# Patient Record
Sex: Female | Born: 1950
Health system: Southern US, Community
[De-identification: ages and names within clinical notes are randomized; demographics above are authoritative.]

## PROBLEM LIST (undated history)

## (undated) DIAGNOSIS — E785 Hyperlipidemia, unspecified: Secondary | ICD-10-CM

## (undated) DIAGNOSIS — F32A Depression, unspecified: Secondary | ICD-10-CM

## (undated) DIAGNOSIS — F329 Major depressive disorder, single episode, unspecified: Secondary | ICD-10-CM

## (undated) DIAGNOSIS — E079 Disorder of thyroid, unspecified: Secondary | ICD-10-CM

## (undated) DIAGNOSIS — F419 Anxiety disorder, unspecified: Secondary | ICD-10-CM

## (undated) HISTORY — PX: MOUTH SURGERY: SHX715

## (undated) HISTORY — PX: OTHER SURGICAL HISTORY: SHX169

## (undated) HISTORY — DX: Disorder of thyroid, unspecified: E07.9

## (undated) HISTORY — DX: Depression, unspecified: F32.A

## (undated) HISTORY — DX: Anxiety disorder, unspecified: F41.9

## (undated) HISTORY — DX: Hyperlipidemia, unspecified: E78.5

## (undated) HISTORY — PX: SHOULDER SURGERY: SHX246

## (undated) HISTORY — PX: WISDOM TOOTH EXTRACTION: SHX21

## (undated) HISTORY — DX: Major depressive disorder, single episode, unspecified: F32.9

## (undated) HISTORY — PX: TUBAL LIGATION: SHX77

---

## 1998-08-01 ENCOUNTER — Ambulatory Visit (HOSPITAL_COMMUNITY): Admission: RE | Admit: 1998-08-01 | Discharge: 1998-08-01 | Payer: Self-pay | Admitting: Otolaryngology

## 1998-08-01 ENCOUNTER — Encounter: Payer: Self-pay | Admitting: Otolaryngology

## 1999-11-11 ENCOUNTER — Encounter: Payer: Self-pay | Admitting: Obstetrics & Gynecology

## 1999-11-11 ENCOUNTER — Encounter: Admission: RE | Admit: 1999-11-11 | Discharge: 1999-11-11 | Payer: Self-pay | Admitting: Obstetrics & Gynecology

## 2000-11-11 ENCOUNTER — Encounter: Admission: RE | Admit: 2000-11-11 | Discharge: 2000-11-11 | Payer: Self-pay | Admitting: Thoracic Surgery

## 2000-11-11 ENCOUNTER — Encounter: Payer: Self-pay | Admitting: Obstetrics & Gynecology

## 2000-12-15 ENCOUNTER — Encounter: Payer: Self-pay | Admitting: Internal Medicine

## 2001-09-09 ENCOUNTER — Other Ambulatory Visit: Admission: RE | Admit: 2001-09-09 | Discharge: 2001-09-09 | Payer: Self-pay | Admitting: Obstetrics & Gynecology

## 2001-11-16 ENCOUNTER — Encounter: Payer: Self-pay | Admitting: Obstetrics & Gynecology

## 2001-11-16 ENCOUNTER — Encounter: Admission: RE | Admit: 2001-11-16 | Discharge: 2001-11-16 | Payer: Self-pay | Admitting: Obstetrics & Gynecology

## 2002-09-20 ENCOUNTER — Encounter: Payer: Self-pay | Admitting: Orthopedic Surgery

## 2002-09-20 ENCOUNTER — Ambulatory Visit (HOSPITAL_COMMUNITY): Admission: RE | Admit: 2002-09-20 | Discharge: 2002-09-20 | Payer: Self-pay | Admitting: Orthopedic Surgery

## 2002-10-14 ENCOUNTER — Other Ambulatory Visit: Admission: RE | Admit: 2002-10-14 | Discharge: 2002-10-14 | Payer: Self-pay | Admitting: Obstetrics & Gynecology

## 2002-11-22 ENCOUNTER — Encounter: Payer: Self-pay | Admitting: Obstetrics & Gynecology

## 2002-11-22 ENCOUNTER — Encounter: Admission: RE | Admit: 2002-11-22 | Discharge: 2002-11-22 | Payer: Self-pay | Admitting: Obstetrics & Gynecology

## 2002-12-29 ENCOUNTER — Ambulatory Visit (HOSPITAL_COMMUNITY): Admission: RE | Admit: 2002-12-29 | Discharge: 2002-12-29 | Payer: Self-pay | Admitting: Orthopedic Surgery

## 2002-12-29 ENCOUNTER — Ambulatory Visit (HOSPITAL_BASED_OUTPATIENT_CLINIC_OR_DEPARTMENT_OTHER): Admission: RE | Admit: 2002-12-29 | Discharge: 2002-12-29 | Payer: Self-pay | Admitting: Orthopedic Surgery

## 2003-11-21 ENCOUNTER — Other Ambulatory Visit: Admission: RE | Admit: 2003-11-21 | Discharge: 2003-11-21 | Payer: Self-pay | Admitting: Obstetrics & Gynecology

## 2003-11-23 ENCOUNTER — Encounter: Admission: RE | Admit: 2003-11-23 | Discharge: 2003-11-23 | Payer: Self-pay | Admitting: Obstetrics & Gynecology

## 2004-11-25 ENCOUNTER — Encounter: Admission: RE | Admit: 2004-11-25 | Discharge: 2004-11-25 | Payer: Self-pay | Admitting: Obstetrics & Gynecology

## 2004-12-05 ENCOUNTER — Other Ambulatory Visit: Admission: RE | Admit: 2004-12-05 | Discharge: 2004-12-05 | Payer: Self-pay | Admitting: Obstetrics & Gynecology

## 2005-11-27 ENCOUNTER — Encounter: Admission: RE | Admit: 2005-11-27 | Discharge: 2005-11-27 | Payer: Self-pay | Admitting: Obstetrics & Gynecology

## 2006-12-01 ENCOUNTER — Encounter: Admission: RE | Admit: 2006-12-01 | Discharge: 2006-12-01 | Payer: Self-pay | Admitting: Obstetrics & Gynecology

## 2007-12-02 ENCOUNTER — Encounter: Admission: RE | Admit: 2007-12-02 | Discharge: 2007-12-02 | Payer: Self-pay | Admitting: Obstetrics & Gynecology

## 2007-12-08 ENCOUNTER — Encounter: Admission: RE | Admit: 2007-12-08 | Discharge: 2007-12-08 | Payer: Self-pay | Admitting: Obstetrics & Gynecology

## 2008-12-06 ENCOUNTER — Encounter: Admission: RE | Admit: 2008-12-06 | Discharge: 2008-12-06 | Payer: Self-pay | Admitting: Obstetrics & Gynecology

## 2009-11-30 ENCOUNTER — Telehealth: Payer: Self-pay | Admitting: Internal Medicine

## 2009-12-04 ENCOUNTER — Ambulatory Visit: Payer: Self-pay | Admitting: Internal Medicine

## 2009-12-04 DIAGNOSIS — R197 Diarrhea, unspecified: Secondary | ICD-10-CM | POA: Insufficient documentation

## 2009-12-04 DIAGNOSIS — E039 Hypothyroidism, unspecified: Secondary | ICD-10-CM | POA: Insufficient documentation

## 2009-12-04 LAB — CONVERTED CEMR LAB
Basophils Absolute: 0 10*3/uL (ref 0.0–0.1)
Eosinophils Absolute: 0.1 10*3/uL (ref 0.0–0.7)
HCT: 37.6 % (ref 36.0–46.0)
Hemoglobin: 13 g/dL (ref 12.0–15.0)
Lymphs Abs: 1.5 10*3/uL (ref 0.7–4.0)
MCHC: 34.6 g/dL (ref 30.0–36.0)
MCV: 92 fL (ref 78.0–100.0)
Neutro Abs: 3.1 10*3/uL (ref 1.4–7.7)
RDW: 12.9 % (ref 11.5–14.6)

## 2009-12-05 ENCOUNTER — Encounter: Payer: Self-pay | Admitting: Physician Assistant

## 2009-12-10 ENCOUNTER — Encounter: Admission: RE | Admit: 2009-12-10 | Discharge: 2009-12-10 | Payer: Self-pay | Admitting: Internal Medicine

## 2010-04-22 ENCOUNTER — Encounter: Payer: Self-pay | Admitting: Obstetrics & Gynecology

## 2010-05-02 NOTE — Procedures (Signed)
Summary: Colonoscopy   Colonoscopy  Procedure date:  12/15/2000  Findings:      Location:  [facility].   Patient Name: Jo Vega, Jo Vega MRN:  Procedure Procedures: Colonoscopy CPT: 16109.  Personnel: Endoscopist: Dora L. Juanda Chance, MD.  Referred By: Maureen Ralphs Daphine Deutscher, MD.  Exam Location: Exam performed in Outpatient Clinic. Outpatient  Patient Consent: Procedure, Alternatives, Risks and Benefits discussed, consent obtained, from patient.  Indications Symptoms: Hematochezia.  Average Risk Screening Routine.  History  Pre-Exam Physical: Performed Dec 15, 2000. Cardio-pulmonary exam, Rectal exam, HEENT exam , Abdominal exam, Extremity exam, Neurological exam, Mental status exam WNL.  Exam Exam: Extent of exam reached: Cecum, extent intended: Cecum.  Colon retroflexion performed. Images taken. ASA Classification: I. Tolerance: good.  Monitoring: Pulse and BP monitoring, Oximetry used. Supplemental O2 given.  Colon Prep Used Golytely for colon prep. Prep results: fair.  Sedation Meds: Patient assessed and found to be appropriate for moderate (conscious) sedation. Fentanyl 100 mcg. Versed 10 mg.  Findings NORMAL EXAM: Cecum.   Assessment Normal examination.  Comments: no polyps Events  Unplanned Interventions: No intervention was required.  Unplanned Events: There were no complications. Plans Patient Education: Patient given standard instructions for: Yearly hemoccult testing recommended. Patient instructed to get routine colonoscopy every 10 years.  Comments: high fiber diet Disposition: After procedure patient sent to recovery. After recovery patient sent home.  Scheduling/Referral: Follow-Up prn.   This report was created from the original endoscopy report, which was reviewed and signed by the above listed endoscopist.     cc: Madison Hickman, MD     W.Varney Baas, MD

## 2010-05-02 NOTE — Progress Notes (Signed)
Summary: Triage  Phone Note Call from Patient Call back at Home Phone [phone] Call back at [phone]   Caller: Patient Call For: Dr. Juanda Chance Reason for Call: Talk to Nurse Summary of Call: continuous diarrhea since July....wants to know if she can be worked in with [employer]. Initial call taken by: Karna Christmas,  November 30, 2009 11:26 AM  Follow-up for Phone Call        patient with a 4 month hx of diarrhea.  She has been treated bt Dr Waynard Edwards with no success.  She reports daily diarrhea.  Patient  will come in and see Dr Juanda Chance on 12-04-09 10:30.  I have asked her to contact Dr Perini's office to send [place] records.   Follow-up by: Darcey Nora RN, CGRN,  November 30, 2009 11:40 AM

## 2010-05-02 NOTE — Assessment & Plan Note (Signed)
Summary: diarrhea/Jo Vega   History of Present Illness Visit Type: Initial Visit Primary GI MD: Jo Sar MD Primary Provider: Rodrigo Ran, MD Chief Complaint: Chronic diarhea, started 09/28/09, History of Present Illness:   PLEASANT 60 YO FEMALE KNOWN TO DR. Juanda Chance FROM SCREENING COLONOSCOPY IN 2002. THIS WAS NORMAL. SHE IS REFERRED TODAY WITH PERSISTENT DIARRHEA,ONSET ACUTELY 7/4 WEEKEND. SHE HAD BEEN ON A SAILING TRIP WITH HER HUSBAND. APPARENTLY NO ONE ELSE BECAME ILL BUT HER HUSBAND DID HAVE SOME MILD DIARRHEA. SHE DRANK BOTTLED WATER,DID NOT SWIM IN THE OCEAN,ATE SEAFOOD,BUT NOT RAW. SHE HAS BEEN HAVING DIARRHEA EVER SINCE WITH URGENCY AND A COUPLE OF INCONTINENT EPISODES. GENERALLY HAVING 7-8 LOOSE TO WATERY STOOLS PER DAY,NONBLOODY, NONMALODOROUS. NO FEVER. NO N/V,MINIMAL CRAMPING. SHE WAS SEEN AT DR.PERINI'S OFFICE-NO STOOL CULTURES DONE. INITIALLY GIVEN ALIGN AND IMMODIUM,NO IMPROVEMENT. THEN A COURSE OF FLAGYL X 7 DAYS, NO IMPROVEMENT, THEN ANOTHER ABX X 3 DAYS THAT SHE DOES NOT KNOW THE NAME OF, NO HELP.  SHE DID TAKE AN ABX IN MAY FOR A UTI, NO OTHER NEW MEDS ETC. CBC,AND BMET 10/13/09 UNREMARKABLE.   GI Review of Systems    Reports acid reflux.      Denies abdominal pain, belching, bloating, chest pain, dysphagia with liquids, dysphagia with solids, heartburn, loss of appetite, nausea, vomiting, vomiting blood, weight loss, and  weight gain.      Reports change in bowel habits, diarrhea, and  fecal incontinence.     Denies anal fissure, black tarry stools, constipation, diverticulosis, heme positive stool, hemorrhoids, irritable bowel syndrome, jaundice, light color stool, liver problems, rectal bleeding, and  rectal pain. Preventive Screening-Counseling & Management  Alcohol-Tobacco     Smoking Status: never      Drug Use:  no.      Current Medications (verified): 1)  Synthroid 50 Mcg Tabs (Levothyroxine Sodium) .... Once Daily 2)  Multivitamins  Tabs (Multiple Vitamin)  .... Once Daily 3)  Calcium 500 Mg Tabs (Calcium) .... Once Daily 4)  Aspirin 81 Mg Tbec (Aspirin) .... Once Daily 5)  Fish Oil 1000 Mg Caps (Omega-3 Fatty Acids) .... Once Daily 6)  Vitamin C 500 Mg Tabs (Ascorbic Acid) .... Once Daily 7)  Glucosamine-Chondroitin  Caps (Glucosamine-Chondroit-Vit C-Mn) .... Once Daily 8)  Vitamin D 400 Unit Tabs (Cholecalciferol) .... Once Daily 9)  Tylenol Pm Extra Strength 500-25 Mg Tabs (Diphenhydramine-Apap (Sleep)) .... At Bedtime For Sleep  Allergies (verified): 1)  ! Codeine  Past History:  Past Medical History: HYPOTHYROID  Past Surgical History: Tubal Ligation Shoulder Surgery  Family History: Family History of Breast Cancer:Mother No FH of Colon Cancer:  Social History: Occupation:  MARRIED Patient has never smoked.  Alcohol Use - yes Daily Caffeine Use Illicit Drug Use - no Smoking Status:  never Drug Use:  no  Review of Systems  The patient denies allergy/sinus, anemia, anxiety-new, arthritis/joint pain, back pain, blood in urine, breast changes/lumps, change in vision, confusion, cough, coughing up blood, depression-new, fainting, fatigue, fever, headaches-new, hearing problems, heart murmur, heart rhythm changes, itching, menstrual pain, muscle pains/cramps, night sweats, nosebleeds, pregnancy symptoms, shortness of breath, skin rash, sleeping problems, sore throat, swelling of feet/legs, swollen lymph glands, thirst - excessive , urination - excessive , urination changes/pain, urine leakage, vision changes, and voice change.         SEE HPI  Vital Signs:  Patient profile:   60 year old female Height:      64 inches Weight:      138.13 pounds BMI:  23.80 Pulse rate:   68 / minute Pulse rhythm:   regular BP sitting:   108 / 70  (left arm) Cuff size:   regular  Vitals Entered By: June McMurray CMA Duncan Dull) (December 04, 2009 10:39 AM)  Physical Exam  General:  Well developed, well nourished, no acute  distress. Head:  Normocephalic and atraumatic. Eyes:  PERRLA, no icterus. Lungs:  Clear throughout to auscultation. Heart:  Regular rate and rhythm; no murmurs, rubs,  or bruits. Abdomen:  SOFT, NONTENDER, NO MASS OR HSM,BS+ Rectal:  NOT DONE Extremities:  No clubbing, cyanosis, edema or deformities noted. Neurologic:  Alert and  oriented x4;  grossly normal neurologically. Psych:  Alert and cooperative. Normal mood and affect.   Impression & Recommendations:  Problem # 1:  DIARRHEA (ICD-787.91) Assessment New 59 YO FEMALE WITH 8 WEEK HX OF PERSISTENT DIARRHEA,ACUTE ONSET-UNRESPONSIVE TO FLAGYL X ONE WEEK, AND A 3 DAY COURSE OF ANOTHER ABX... STILL SUSPECT INFECTIOUS ETIOLOGY, R/O GIARDIASIS,R/O C.DIFF.  LABS AND STOOL CULTURES AS BELOW HOLD ABX UNTIL CULTURES REVIEWED CONTINUE ALIGN ONE DAILY X 30 MORE DAYS IMMODIUM 2 EACH AM THEN AS NEEDED DURING DAY MAY NEED COLONOSCOPY ,BUT WILL REVIEW CULTURES/TREAT FIRST. Orders: TLB-CBC Platelet - w/Differential (85025-[id number]) TLB-Sedimentation Rate (ESR) (85652-ESR) T-Culture, Stool ([id number]) T-Culture, C-Diff Toxin A/B ([id number]) T-Stool Giardia / Crypto- EIA (09811) T-Stool for O&P ([id number]) T-Fecal WBC ([id number])  Problem # 2:  HYPOTHYROIDISM (ICD-244.9) Assessment: Comment Only  Patient Instructions: 1)  Please go to lab, basement level. 2)  Continue Align once daily for 2 months. 3)  Take Imodium 2 each AM up to 8 per day. 4)  Copy sent to : Dr. Rodrigo Ran 5)  The medication list was reviewed and reconciled.  All changed / newly prescribed medications were explained.  A complete medication list was provided to the patient / caregiver.

## 2010-06-17 ENCOUNTER — Encounter: Payer: Self-pay | Admitting: Internal Medicine

## 2010-06-27 NOTE — Letter (Signed)
Summary: Pre Visit Letter Revised  [facility]  [address] [place], [place] [address]   Phone: [phone]  Fax: [phone]        06/17/2010 MRN: [id number] Jo Vega [address] [place], [place]  [address]             Procedure Date:  08-06-10 9am            Dr Juanda Chance   Recall Colon                Welcome to the Gastroenterology Division at [facility].    You are scheduled to see a nurse for your pre-procedure visit on 07-22-10  at 10am on the 3rd floor at [facility], [address]  We ask that you try to arrive at our office 15 minutes prior to your appointment time to allow for check-in.  Please take a minute to review the attached form.  If you answer "Yes" to one or more of the questions on the first page, we ask that you call the person listed at your earliest opportunity.  If you answer "No" to all of the questions, please complete the rest of the form and bring it to your appointment.    Your nurse visit will consist of discussing your medical and surgical history, your immediate family medical history, and your medications.   If you are unable to list all of your medications on the form, please bring the medication bottles to your appointment and we will list them.  We will need to be aware of both prescribed and over the counter drugs.  We will need to know exact dosage information as well.    Please be prepared to read and sign documents such as consent forms, a financial agreement, and acknowledgement forms.  If necessary, and with your consent, a friend or relative is welcome to sit-in on the nurse visit with you.  Please bring your insurance card so that we may make a copy of it.  If your insurance requires a referral to see a specialist, please bring your referral form from your primary care physician.  No co-pay is required for this nurse visit.     If you cannot keep your appointment, please call [phone] to cancel or  reschedule prior to your appointment date.  This allows [place] the opportunity to schedule an appointment for another patient in need of care.    Thank you for choosing [facility] for your medical needs.  We appreciate the opportunity to care for you.  Please visit [place] at our website  to learn more about our practice.  Sincerely, The Gastroenterology Division

## 2010-07-22 ENCOUNTER — Ambulatory Visit (AMBULATORY_SURGERY_CENTER): Payer: BC Managed Care – PPO | Admitting: *Deleted

## 2010-07-22 ENCOUNTER — Encounter: Payer: Self-pay | Admitting: Internal Medicine

## 2010-07-22 VITALS — Ht 64.0 in | Wt 136.2 lb

## 2010-07-22 DIAGNOSIS — Z1211 Encounter for screening for malignant neoplasm of colon: Secondary | ICD-10-CM

## 2010-07-22 MED ORDER — PEG-KCL-NACL-NASULF-NA ASC-C 100 G PO SOLR: ORAL | Status: DC

## 2010-08-06 ENCOUNTER — Other Ambulatory Visit: Payer: Self-pay | Admitting: Internal Medicine

## 2010-08-15 ENCOUNTER — Other Ambulatory Visit: Payer: BC Managed Care – PPO | Admitting: Internal Medicine

## 2010-08-16 NOTE — Op Note (Signed)
NAMESANDE, PICKERT                            ACCOUNT NO.:  [id number]   MEDICAL RECORD NO.:  [id number]                   PATIENT TYPE:  AMB   LOCATION:  [facility]                                  FACILITY:  [facility]   PHYSICIAN:  Katy Fitch. Naaman Plummer., M.D.          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  Also, acromioclavicular arthropathy  documented by plain films and MRI and signs of a near full thickness  degenerative tear of the infraspinatus rotator cuff tendon.   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  Also, acromioclavicular arthropathy  documented by plain films and MRI and signs of a near full thickness  degenerative tear of the infraspinatus rotator cuff tendon.  Identification  of minor synovitis within the glenohumeral joint and a chronic stage II  impingement with florid bursitis.   OPERATION PERFORMED:  1. Examination of left shoulder under anesthesia.  2. Arthroscopic evaluation of glenohumeral joint with debridement of minor     synovitis and debridement of extensive degenerative tearing of     supraspinatus and infraspinatus deep surfaces measuring approximately 30%     of the tendon width at the insertion of the greater tuberosity and type     degeneration of the anterior, superior and posterior labrum from     approximately 2 o'clock to 10 o'clock posteriorly.  3. Arthroscopic subacromial decompression with release of coracoacromial     ligament, bursectomy, acromioplasty and documentation of intact bursal     surface of rotator cuff.  4. Open resection of  distal clavicle, i.e. Mumford procedure.   SURGEON:  Katy Fitch. Sypher, M.D.   ASSISTANT:  Jonni Sanger, P.A.   ANESTHESIA:  General orotracheal supplemented by interscalene block.   SUPERVISING ANESTHESIOLOGIST:  Dr. Jean Rosenthal.   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  Since January of 2004 she has had  increasing pain in her left shoulder made worse by weightbearing activities,  overhead activities and painful at night when she attempts to sleep on the  left side.  She tolerated this for approximately six months and tried home  remedies including exercise and yoga training.  With persistent discomfort,  she sought an upper extremity orthopedic consult on September 16, 2002.  Clinical  examination at that time suggested significant rotator cuff tendinopathy  with probable stage II impingement.  Plain films suggested a degree of  acromioclavicular arthropathy and clinical examination demonstrated some  weakness of abduction and external rotation.  She was noted to have a type 2 acromion and sclerosis of the greater  tuberosity that is typically associated with degenerative tendinopathy.  There was no sign of glenohumeral degenerative change.  She was referred for  an MRI at [facility] that was obtained on September 20, 2002  and interpreted by Bridgett Larsson, M.D.  Dr. Constance Goltz noted significant  intrasubstance partial tears of the infraspinatus and supraspinatus and a  degree of hemorrhage within the posterior aspect of the supraspinatus  consistent with a very small partial or moderate thickness tear of the  rotator cuff.  She was noted to have a type 2 acromion and degenerative  change at the acromioclavicular joint.   On follow-up consultation on October 10, 2002 she noted persistent discomfort.  We advised her to try continued therapeutic exercise and a period of  observation to see if her  symptoms would abate.  With continuing pain for an  additional three months, she now presents for operative intervention.  Preoperatively, she was advised to strongly consider open resection of the  distal clavicle with the advantages being the ability to repair the  trapezius and deltoid muscles for stronger initial repair and closure of the  dead space created by distal clavicle resection.  We discussed alternative  means of treatment including arthroscopic resection of the distal clavicle.  We planned on debriding the rotator cuff and possibly repairing the rotator  cuff tear if it was found to be full thickness or more extensive than the  MRI study in June had suggested.   Preoperatively, she was advised to have surgery with an interscalene block.  Germaine Pomfret, M.D. performed detailed preoperative informed consent  regarding the risks and benefits of intrascalene block.  Our plan is to  perform surgery on an outpatient basis with analgesia from the block.  However, should we find a full thickness rotator cuff tear or perform a  significant incision, I would recommend overnight observation or intravenous  antibiotics and enhanced pain control.   DESCRIPTION OF PROCEDURE:  Deltha Bernales was brought to the operating room and  placed in supine position on the operating table.  Following induction of  general anesthesia, she was carefully positioned in beach chair position  with the aid of a torso and head holder designed for shoulder arthroscopy.  The left upper extremity was carefully examined under anesthesia.  She was  noted to have range of motion 0 to 180 degrees of abduction and elevation.  She had 95 degrees of external rotation and 90 degrees abduction and  approximately 80 degrees of internal rotation.  The shoulder was stable at  30, 90 and 120 degrees of elevation to anterior posterior stress.  There was  no sign of an unstable labrum or generalized ligamentous  laxity.  After routine prep of the left upper extremity and forequarter, impervious  arthroscopy drapes and impervious stockinette allowing free motion of the  arm were applied.  The shoulder was distended with 20mL of sterile saline  with a 20 gauge spinal needle from a posterior approach.  The arthroscope  was introduced with blunt technique from posterior standard portal.  Diagnostic arthroscopy revealed a remarkable degree of labral degeneration  with multiple fragments of labrum hanging within the joint.  The biceps  anchor was intact.  The biceps tendon was pristine.  The rotator interval  was inspected and the biceps was noted to be intact distally to its passage  between the tuberosities.  The  subscapularis was normal in appearance.  There was moderate degree of synovitis noted anteriorly.  There was a small  degree of adhesions noted anteriorly probably due to a very minor adhesive  capsulitis.  The deep surface of the supraspinatus and infraspinatus tendons  were markedly degenerative with multiple fragments of frayed degenerative  tendon visible within the joint.  The deep surface of the teres minor was  inspected and found to be normal.  The hyaline articular cartilage surfaces  of the glenoid and humeral head were normal.  The anterior glenohumeral  capsular ligaments were intact.   An anterior portal was created under direct vision after sounding with a  spinal needle.  A 4.5 mm suction shaver was introduced.  A thorough  debridement of the labral fray was accomplished followed by debridement of  all of the degenerative tendon tissue on the deep surface of the  supraspinatus and infraspinatus tendons.  Photographic documentation of the  before and after anatomy was obtained.  We did not identify any signs of a  full thickness cuff tear.   The scope was removed from the glenohumeral joint and placed in the  subacromial space from posterior.  An angry bursitis was noted  and  documented photographically.  A suction shaver was brought in through  lateral portal and a complete bursectomy accomplished allowing visualization  of the rotator cuff from the subscapularis anteriorly to the teres minor  posteriorly.  There was no sign of a full thickness rotator cuff tear.  The  acromial morphology was studied.  There was a clear prominence of the medial  acromion adjacent to the acromioclavicular joint.  A radiofrequency probe  was used to release the coracoacromial ligament and a portion of the  ligament was resected with a 4.5 mm suction shaver.  Hemostasis was achieved  with a radiofrequency probe. The acromioclavicular joint was found to be  mildly unstable and the distal clavicle prominent.   The scope was placed in the lateral portal and a suction shaver used to  level the acromion to a type 1 morphology with particular attention to the  anterolateral lip and the medial acromion adjacent to the acromioclavicular joint.  Approximately 15 mm of distal clavicle was harvested with its  undersurface; however, given the fact that Ms. Caniglia is quite petite and we  were beginning to experience some distention due to saline infusion, I  elected to complete the distal clavicle resection in an open manner.  It is  also my belief that open resection has distinct advantages with respect to  muscle repair and greater strength with few issues of long term heterotopic  ossification following closure of the dead space between the deltoid and  trapezius muscles.   The arthroscopic equipment was removed after photographic documentation of  the acromioplasty.  The distal clavicle was exposed through a 15 mm incision  which was developed a scalpel followed by osteotomes for subperiosteal  exposure of the distal clavicle.  After placing baby Bennett retractors, the  distal 18 mm of clavicle was removed with an oscillating saw.  Care was  taken to palpate the medial acromion.   The resection was noted to be quite  satisfactory.  A rongeur was used to feather the anterior contour of the  distal clavicle.  The dead space after clavicle excision was closed with a  mattress suture of #2 FiberWire followed by closure of the skin with  subdermal sutures of 3-0 Vicryl and intradermal 3-0 Prolene and  Steri-  Strips.  The portals were repaired with intradermal 3-0 Prolene.  They were  infiltrated with 0.25% Marcaine for postoperative analgesia despite the  block.  A voluminous gauze dressing was applied with HypaFix.   Ms. Gengler will be transferred to the recovery room for observation of her  vital signs.  Given the minimal incision for distal resection, in my  judgment, she can be safely discharged home.  We will contact her by phone  this evening to be certain she is faring well.  She is given prescriptions  for Percocet 5 mg one or two tablets by mouth every four to six hours as  needed for pain, 30 tablets without refill, also Motrin 600 mg one by mouth  every six hours as needed for pain and Keflex 500 mg one by mouth every  eight hours times four days as a prophylactic antibiotic.  She will return  to our office for follow-up on the subsequent Monday four days hence to  begin her therapy program.                                               Katy Fitch. Naaman Plummer., M.D.    RVS/MEDQ  D:  12/29/2002  T:  12/29/2002  Job:  [id number]

## 2010-08-30 HISTORY — PX: COLONOSCOPY: SHX174

## 2010-09-05 ENCOUNTER — Other Ambulatory Visit (INDEPENDENT_AMBULATORY_CARE_PROVIDER_SITE_OTHER): Payer: BC Managed Care – PPO

## 2010-09-05 ENCOUNTER — Other Ambulatory Visit: Payer: Self-pay | Admitting: Internal Medicine

## 2010-09-05 ENCOUNTER — Telehealth: Payer: Self-pay | Admitting: *Deleted

## 2010-09-05 ENCOUNTER — Encounter: Payer: Self-pay | Admitting: Internal Medicine

## 2010-09-05 ENCOUNTER — Ambulatory Visit (AMBULATORY_SURGERY_CENTER): Payer: BC Managed Care – PPO | Admitting: Internal Medicine

## 2010-09-05 VITALS — BP 121/67 | HR 81 | Temp 98.0°F | Resp 14 | Ht 64.0 in | Wt 136.0 lb

## 2010-09-05 DIAGNOSIS — R197 Diarrhea, unspecified: Secondary | ICD-10-CM

## 2010-09-05 DIAGNOSIS — Z1211 Encounter for screening for malignant neoplasm of colon: Secondary | ICD-10-CM

## 2010-09-05 DIAGNOSIS — E039 Hypothyroidism, unspecified: Secondary | ICD-10-CM

## 2010-09-05 LAB — TSH: TSH: 3.4 u[IU]/mL (ref 0.35–5.50)

## 2010-09-05 MED ORDER — DICYCLOMINE HCL 10 MG PO CAPS: 10.0000 mg | ORAL_CAPSULE | Freq: Two times a day (BID) | ORAL | Status: DC

## 2010-09-05 MED ORDER — SODIUM CHLORIDE 0.9 % IV SOLN: 500.0000 mL | INTRAVENOUS | Status: AC

## 2010-09-05 NOTE — Progress Notes (Signed)
Pressure applied to abdomen to reach cecum.  

## 2010-09-06 ENCOUNTER — Telehealth: Payer: Self-pay

## 2010-09-06 NOTE — Telephone Encounter (Signed)

## 2010-09-06 NOTE — Telephone Encounter (Deleted)

## 2010-09-09 ENCOUNTER — Telehealth: Payer: Self-pay | Admitting: *Deleted

## 2010-09-09 NOTE — Telephone Encounter (Signed)
Left voicemail for pt. To pick up prescription at her pharmacy.

## 2010-09-09 NOTE — Telephone Encounter (Signed)
Patient given results as per Dr. Juanda Chance. She will call in 4 weeks with an update.

## 2010-09-09 NOTE — Telephone Encounter (Signed)
Message copied by Daphine Deutscher on Mon Sep 09, 2010  9:09 AM ------      Message from: Hart Carwin      Created: Sun Sep 08, 2010  9:26 PM       Please call pt with normal blood tests, negative for celiac disease, continue the medication and call back with an update in about 4 weeks

## 2010-09-10 ENCOUNTER — Encounter: Payer: Self-pay | Admitting: Internal Medicine

## 2010-10-14 ENCOUNTER — Other Ambulatory Visit: Payer: Self-pay | Admitting: Obstetrics & Gynecology

## 2010-10-14 DIAGNOSIS — Z1231 Encounter for screening mammogram for malignant neoplasm of breast: Secondary | ICD-10-CM

## 2010-12-17 ENCOUNTER — Ambulatory Visit: Payer: BC Managed Care – PPO

## 2010-12-23 ENCOUNTER — Ambulatory Visit
Admission: RE | Admit: 2010-12-23 | Discharge: 2010-12-23 | Disposition: A | Discharge status: Home / Self Care | Payer: BC Managed Care – PPO | Source: Ambulatory Visit | Attending: Obstetrics & Gynecology | Admitting: Obstetrics & Gynecology

## 2010-12-23 DIAGNOSIS — Z1231 Encounter for screening mammogram for malignant neoplasm of breast: Secondary | ICD-10-CM

## 2011-11-27 ENCOUNTER — Other Ambulatory Visit: Payer: Self-pay | Admitting: Obstetrics & Gynecology

## 2011-11-27 DIAGNOSIS — Z1231 Encounter for screening mammogram for malignant neoplasm of breast: Secondary | ICD-10-CM

## 2011-12-24 ENCOUNTER — Ambulatory Visit
Admission: RE | Admit: 2011-12-24 | Discharge: 2011-12-24 | Disposition: A | Discharge status: Home / Self Care | Payer: BC Managed Care – PPO | Source: Ambulatory Visit | Attending: Obstetrics & Gynecology | Admitting: Obstetrics & Gynecology

## 2011-12-24 DIAGNOSIS — Z1231 Encounter for screening mammogram for malignant neoplasm of breast: Secondary | ICD-10-CM

## 2011-12-29 ENCOUNTER — Other Ambulatory Visit: Payer: Self-pay | Admitting: Obstetrics & Gynecology

## 2011-12-29 DIAGNOSIS — R928 Other abnormal and inconclusive findings on diagnostic imaging of breast: Secondary | ICD-10-CM

## 2011-12-30 ENCOUNTER — Ambulatory Visit
Admission: RE | Admit: 2011-12-30 | Discharge: 2011-12-30 | Disposition: A | Discharge status: Home / Self Care | Payer: BC Managed Care – PPO | Source: Ambulatory Visit | Attending: Obstetrics & Gynecology | Admitting: Obstetrics & Gynecology

## 2011-12-30 DIAGNOSIS — R928 Other abnormal and inconclusive findings on diagnostic imaging of breast: Secondary | ICD-10-CM

## 2011-12-31 ENCOUNTER — Other Ambulatory Visit: Payer: BC Managed Care – PPO

## 2012-09-22 ENCOUNTER — Other Ambulatory Visit: Payer: Self-pay

## 2012-09-22 DIAGNOSIS — Z1231 Encounter for screening mammogram for malignant neoplasm of breast: Secondary | ICD-10-CM

## 2012-10-12 ENCOUNTER — Other Ambulatory Visit: Payer: Self-pay

## 2012-12-24 ENCOUNTER — Ambulatory Visit
Admission: RE | Admit: 2012-12-24 | Discharge: 2012-12-24 | Disposition: A | Discharge status: Home / Self Care | Payer: BC Managed Care – PPO | Source: Ambulatory Visit

## 2012-12-24 DIAGNOSIS — Z1231 Encounter for screening mammogram for malignant neoplasm of breast: Secondary | ICD-10-CM

## 2013-02-14 ENCOUNTER — Other Ambulatory Visit: Payer: Self-pay | Admitting: Internal Medicine

## 2013-02-14 DIAGNOSIS — I999 Unspecified disorder of circulatory system: Secondary | ICD-10-CM

## 2013-02-18 ENCOUNTER — Other Ambulatory Visit: Payer: Self-pay | Admitting: Internal Medicine

## 2013-02-18 DIAGNOSIS — I999 Unspecified disorder of circulatory system: Secondary | ICD-10-CM

## 2013-02-20 ENCOUNTER — Other Ambulatory Visit: Payer: BC Managed Care – PPO

## 2013-02-21 ENCOUNTER — Ambulatory Visit
Admission: RE | Admit: 2013-02-21 | Discharge: 2013-02-21 | Disposition: A | Discharge status: Home / Self Care | Payer: BC Managed Care – PPO | Source: Ambulatory Visit | Attending: Internal Medicine | Admitting: Internal Medicine

## 2013-02-21 DIAGNOSIS — I999 Unspecified disorder of circulatory system: Secondary | ICD-10-CM

## 2013-03-01 ENCOUNTER — Other Ambulatory Visit: Payer: BC Managed Care – PPO

## 2013-11-02 ENCOUNTER — Other Ambulatory Visit: Payer: Self-pay

## 2013-11-02 DIAGNOSIS — Z1231 Encounter for screening mammogram for malignant neoplasm of breast: Secondary | ICD-10-CM

## 2013-12-26 ENCOUNTER — Ambulatory Visit
Admission: RE | Admit: 2013-12-26 | Discharge: 2013-12-26 | Disposition: A | Discharge status: Home / Self Care | Payer: BC Managed Care – PPO | Source: Ambulatory Visit

## 2013-12-26 DIAGNOSIS — Z1231 Encounter for screening mammogram for malignant neoplasm of breast: Secondary | ICD-10-CM

## 2014-08-15 ENCOUNTER — Other Ambulatory Visit: Payer: Self-pay | Admitting: Obstetrics & Gynecology

## 2014-08-16 LAB — CYTOLOGY - PAP

## 2014-11-27 ENCOUNTER — Other Ambulatory Visit: Payer: Self-pay

## 2014-11-27 DIAGNOSIS — Z1231 Encounter for screening mammogram for malignant neoplasm of breast: Secondary | ICD-10-CM

## 2015-01-03 ENCOUNTER — Ambulatory Visit
Admission: RE | Admit: 2015-01-03 | Discharge: 2015-01-03 | Disposition: A | Discharge status: Home / Self Care | Payer: BLUE CROSS/BLUE SHIELD | Source: Ambulatory Visit

## 2015-01-03 DIAGNOSIS — Z1231 Encounter for screening mammogram for malignant neoplasm of breast: Secondary | ICD-10-CM

## 2015-11-13 ENCOUNTER — Other Ambulatory Visit: Payer: Self-pay | Admitting: Internal Medicine

## 2015-11-13 DIAGNOSIS — R1030 Lower abdominal pain, unspecified: Secondary | ICD-10-CM

## 2015-11-27 ENCOUNTER — Ambulatory Visit
Admission: RE | Admit: 2015-11-27 | Discharge: 2015-11-27 | Disposition: A | Discharge status: Home / Self Care | Payer: BLUE CROSS/BLUE SHIELD | Source: Ambulatory Visit | Attending: Internal Medicine | Admitting: Internal Medicine

## 2015-11-27 DIAGNOSIS — R1030 Lower abdominal pain, unspecified: Secondary | ICD-10-CM

## 2015-12-06 ENCOUNTER — Other Ambulatory Visit: Payer: Self-pay | Admitting: Internal Medicine

## 2015-12-06 DIAGNOSIS — Z1231 Encounter for screening mammogram for malignant neoplasm of breast: Secondary | ICD-10-CM

## 2016-01-02 ENCOUNTER — Other Ambulatory Visit: Payer: Self-pay | Admitting: Internal Medicine

## 2016-01-02 DIAGNOSIS — R1031 Right lower quadrant pain: Secondary | ICD-10-CM

## 2016-01-04 ENCOUNTER — Ambulatory Visit
Admission: RE | Admit: 2016-01-04 | Discharge: 2016-01-04 | Disposition: A | Discharge status: Home / Self Care | Payer: BLUE CROSS/BLUE SHIELD | Source: Ambulatory Visit | Attending: Internal Medicine | Admitting: Internal Medicine

## 2016-01-04 ENCOUNTER — Ambulatory Visit: Payer: BLUE CROSS/BLUE SHIELD

## 2016-01-04 DIAGNOSIS — Z1231 Encounter for screening mammogram for malignant neoplasm of breast: Secondary | ICD-10-CM

## 2016-01-07 ENCOUNTER — Other Ambulatory Visit: Payer: BLUE CROSS/BLUE SHIELD

## 2016-01-10 ENCOUNTER — Ambulatory Visit
Admission: RE | Admit: 2016-01-10 | Discharge: 2016-01-10 | Disposition: A | Discharge status: Home / Self Care | Payer: BLUE CROSS/BLUE SHIELD | Source: Ambulatory Visit | Attending: Internal Medicine | Admitting: Internal Medicine

## 2016-01-10 DIAGNOSIS — R1031 Right lower quadrant pain: Secondary | ICD-10-CM

## 2016-01-10 MED ORDER — IOPAMIDOL (ISOVUE-300) INJECTION 61%
100.0000 mL | Freq: Once | INTRAVENOUS | Status: AC | PRN
  Administered 2016-01-10: 100 mL via INTRAVENOUS

## 2016-02-12 DIAGNOSIS — L821 Other seborrheic keratosis: Secondary | ICD-10-CM | POA: Diagnosis not present

## 2016-02-12 DIAGNOSIS — D2272 Melanocytic nevi of left lower limb, including hip: Secondary | ICD-10-CM | POA: Diagnosis not present

## 2016-02-12 DIAGNOSIS — D1801 Hemangioma of skin and subcutaneous tissue: Secondary | ICD-10-CM | POA: Diagnosis not present

## 2016-02-12 DIAGNOSIS — D485 Neoplasm of uncertain behavior of skin: Secondary | ICD-10-CM | POA: Diagnosis not present

## 2016-03-10 ENCOUNTER — Other Ambulatory Visit: Payer: Self-pay | Admitting: Internal Medicine

## 2016-03-10 DIAGNOSIS — K769 Liver disease, unspecified: Secondary | ICD-10-CM

## 2016-03-28 ENCOUNTER — Ambulatory Visit
Admission: RE | Admit: 2016-03-28 | Discharge: 2016-03-28 | Disposition: A | Discharge status: Home / Self Care | Payer: PPO | Source: Ambulatory Visit | Attending: Internal Medicine | Admitting: Internal Medicine

## 2016-03-28 DIAGNOSIS — K769 Liver disease, unspecified: Secondary | ICD-10-CM

## 2016-03-28 DIAGNOSIS — K7689 Other specified diseases of liver: Secondary | ICD-10-CM | POA: Diagnosis not present

## 2016-03-28 MED ORDER — GADOBENATE DIMEGLUMINE 529 MG/ML IV SOLN
12.0000 mL | Freq: Once | INTRAVENOUS | Status: AC | PRN
  Administered 2016-03-28: 12 mL via INTRAVENOUS

## 2016-05-07 DIAGNOSIS — L235 Allergic contact dermatitis due to other chemical products: Secondary | ICD-10-CM | POA: Diagnosis not present

## 2016-05-07 DIAGNOSIS — J3089 Other allergic rhinitis: Secondary | ICD-10-CM | POA: Diagnosis not present

## 2016-05-12 DIAGNOSIS — L239 Allergic contact dermatitis, unspecified cause: Secondary | ICD-10-CM | POA: Diagnosis not present

## 2016-05-12 DIAGNOSIS — L814 Other melanin hyperpigmentation: Secondary | ICD-10-CM | POA: Diagnosis not present

## 2016-05-12 DIAGNOSIS — I788 Other diseases of capillaries: Secondary | ICD-10-CM | POA: Diagnosis not present

## 2016-05-12 DIAGNOSIS — D2271 Melanocytic nevi of right lower limb, including hip: Secondary | ICD-10-CM | POA: Diagnosis not present

## 2016-05-12 DIAGNOSIS — Z85828 Personal history of other malignant neoplasm of skin: Secondary | ICD-10-CM | POA: Diagnosis not present

## 2016-05-12 DIAGNOSIS — L821 Other seborrheic keratosis: Secondary | ICD-10-CM | POA: Diagnosis not present

## 2016-05-12 DIAGNOSIS — L570 Actinic keratosis: Secondary | ICD-10-CM | POA: Diagnosis not present

## 2016-05-12 DIAGNOSIS — D1801 Hemangioma of skin and subcutaneous tissue: Secondary | ICD-10-CM | POA: Diagnosis not present

## 2016-05-12 DIAGNOSIS — L853 Xerosis cutis: Secondary | ICD-10-CM | POA: Diagnosis not present

## 2016-05-12 DIAGNOSIS — L738 Other specified follicular disorders: Secondary | ICD-10-CM | POA: Diagnosis not present

## 2016-06-10 DIAGNOSIS — L235 Allergic contact dermatitis due to other chemical products: Secondary | ICD-10-CM | POA: Diagnosis not present

## 2016-06-10 DIAGNOSIS — J3089 Other allergic rhinitis: Secondary | ICD-10-CM | POA: Diagnosis not present

## 2016-06-12 DIAGNOSIS — J3089 Other allergic rhinitis: Secondary | ICD-10-CM | POA: Diagnosis not present

## 2016-06-12 DIAGNOSIS — L235 Allergic contact dermatitis due to other chemical products: Secondary | ICD-10-CM | POA: Diagnosis not present

## 2016-09-10 DIAGNOSIS — Z6824 Body mass index (BMI) 24.0-24.9, adult: Secondary | ICD-10-CM | POA: Diagnosis not present

## 2016-09-10 DIAGNOSIS — Z01419 Encounter for gynecological examination (general) (routine) without abnormal findings: Secondary | ICD-10-CM | POA: Diagnosis not present

## 2016-09-11 DIAGNOSIS — H5203 Hypermetropia, bilateral: Secondary | ICD-10-CM | POA: Diagnosis not present

## 2016-09-11 DIAGNOSIS — H524 Presbyopia: Secondary | ICD-10-CM | POA: Diagnosis not present

## 2016-09-29 ENCOUNTER — Other Ambulatory Visit: Payer: Self-pay | Admitting: Internal Medicine

## 2016-09-29 DIAGNOSIS — Z1231 Encounter for screening mammogram for malignant neoplasm of breast: Secondary | ICD-10-CM

## 2016-10-23 DIAGNOSIS — L244 Irritant contact dermatitis due to drugs in contact with skin: Secondary | ICD-10-CM | POA: Diagnosis not present

## 2016-10-23 DIAGNOSIS — L570 Actinic keratosis: Secondary | ICD-10-CM | POA: Diagnosis not present

## 2016-12-04 DIAGNOSIS — R21 Rash and other nonspecific skin eruption: Secondary | ICD-10-CM | POA: Diagnosis not present

## 2016-12-04 DIAGNOSIS — L659 Nonscarring hair loss, unspecified: Secondary | ICD-10-CM | POA: Diagnosis not present

## 2016-12-17 DIAGNOSIS — L239 Allergic contact dermatitis, unspecified cause: Secondary | ICD-10-CM | POA: Diagnosis not present

## 2016-12-30 DIAGNOSIS — Z0000 Encounter for general adult medical examination without abnormal findings: Secondary | ICD-10-CM | POA: Diagnosis not present

## 2016-12-30 DIAGNOSIS — E038 Other specified hypothyroidism: Secondary | ICD-10-CM | POA: Diagnosis not present

## 2016-12-30 DIAGNOSIS — E784 Other hyperlipidemia: Secondary | ICD-10-CM | POA: Diagnosis not present

## 2016-12-31 DIAGNOSIS — L239 Allergic contact dermatitis, unspecified cause: Secondary | ICD-10-CM | POA: Diagnosis not present

## 2017-01-05 ENCOUNTER — Ambulatory Visit
Admission: RE | Admit: 2017-01-05 | Discharge: 2017-01-05 | Disposition: A | Discharge status: Home / Self Care | Payer: PPO | Source: Ambulatory Visit | Attending: Internal Medicine | Admitting: Internal Medicine

## 2017-01-05 DIAGNOSIS — Z1231 Encounter for screening mammogram for malignant neoplasm of breast: Secondary | ICD-10-CM

## 2017-01-06 DIAGNOSIS — R1030 Lower abdominal pain, unspecified: Secondary | ICD-10-CM | POA: Diagnosis not present

## 2017-01-06 DIAGNOSIS — Z0000 Encounter for general adult medical examination without abnormal findings: Secondary | ICD-10-CM | POA: Diagnosis not present

## 2017-01-06 DIAGNOSIS — E7849 Other hyperlipidemia: Secondary | ICD-10-CM | POA: Diagnosis not present

## 2017-01-06 DIAGNOSIS — E038 Other specified hypothyroidism: Secondary | ICD-10-CM | POA: Diagnosis not present

## 2017-01-06 DIAGNOSIS — H6982 Other specified disorders of Eustachian tube, left ear: Secondary | ICD-10-CM | POA: Diagnosis not present

## 2017-01-06 DIAGNOSIS — Z6824 Body mass index (BMI) 24.0-24.9, adult: Secondary | ICD-10-CM | POA: Diagnosis not present

## 2017-01-06 DIAGNOSIS — Z23 Encounter for immunization: Secondary | ICD-10-CM | POA: Diagnosis not present

## 2017-01-06 DIAGNOSIS — Z1389 Encounter for screening for other disorder: Secondary | ICD-10-CM | POA: Diagnosis not present

## 2017-01-06 DIAGNOSIS — K769 Liver disease, unspecified: Secondary | ICD-10-CM | POA: Diagnosis not present

## 2017-01-06 DIAGNOSIS — I440 Atrioventricular block, first degree: Secondary | ICD-10-CM | POA: Diagnosis not present

## 2017-01-06 DIAGNOSIS — R3121 Asymptomatic microscopic hematuria: Secondary | ICD-10-CM | POA: Diagnosis not present

## 2017-01-06 DIAGNOSIS — R21 Rash and other nonspecific skin eruption: Secondary | ICD-10-CM | POA: Diagnosis not present

## 2017-01-06 DIAGNOSIS — G4709 Other insomnia: Secondary | ICD-10-CM | POA: Diagnosis not present

## 2017-01-07 ENCOUNTER — Other Ambulatory Visit: Payer: Self-pay | Admitting: Internal Medicine

## 2017-01-07 DIAGNOSIS — R928 Other abnormal and inconclusive findings on diagnostic imaging of breast: Secondary | ICD-10-CM

## 2017-01-08 ENCOUNTER — Ambulatory Visit
Admission: RE | Admit: 2017-01-08 | Discharge: 2017-01-08 | Disposition: A | Discharge status: Home / Self Care | Payer: PPO | Source: Ambulatory Visit | Attending: Internal Medicine | Admitting: Internal Medicine

## 2017-01-08 ENCOUNTER — Ambulatory Visit: Admission: RE | Admit: 2017-01-08 | Payer: PPO | Source: Ambulatory Visit

## 2017-01-08 DIAGNOSIS — R928 Other abnormal and inconclusive findings on diagnostic imaging of breast: Secondary | ICD-10-CM

## 2017-01-08 DIAGNOSIS — R922 Inconclusive mammogram: Secondary | ICD-10-CM | POA: Diagnosis not present

## 2017-01-09 DIAGNOSIS — Z1212 Encounter for screening for malignant neoplasm of rectum: Secondary | ICD-10-CM | POA: Diagnosis not present

## 2017-01-12 ENCOUNTER — Other Ambulatory Visit: Payer: PPO

## 2017-02-09 DIAGNOSIS — M19011 Primary osteoarthritis, right shoulder: Secondary | ICD-10-CM | POA: Diagnosis not present

## 2017-02-10 DIAGNOSIS — Z780 Asymptomatic menopausal state: Secondary | ICD-10-CM | POA: Diagnosis not present

## 2017-02-10 DIAGNOSIS — N390 Urinary tract infection, site not specified: Secondary | ICD-10-CM | POA: Diagnosis not present

## 2017-02-10 DIAGNOSIS — R300 Dysuria: Secondary | ICD-10-CM | POA: Diagnosis not present

## 2017-03-02 DIAGNOSIS — L2389 Allergic contact dermatitis due to other agents: Secondary | ICD-10-CM | POA: Diagnosis not present

## 2017-03-04 DIAGNOSIS — L239 Allergic contact dermatitis, unspecified cause: Secondary | ICD-10-CM | POA: Diagnosis not present

## 2017-03-06 DIAGNOSIS — L232 Allergic contact dermatitis due to cosmetics: Secondary | ICD-10-CM | POA: Diagnosis not present

## 2017-04-07 DIAGNOSIS — D23111 Other benign neoplasm of skin of right upper eyelid, including canthus: Secondary | ICD-10-CM | POA: Diagnosis not present

## 2017-06-23 DIAGNOSIS — D2271 Melanocytic nevi of right lower limb, including hip: Secondary | ICD-10-CM | POA: Diagnosis not present

## 2017-06-23 DIAGNOSIS — L821 Other seborrheic keratosis: Secondary | ICD-10-CM | POA: Diagnosis not present

## 2017-06-23 DIAGNOSIS — D2262 Melanocytic nevi of left upper limb, including shoulder: Secondary | ICD-10-CM | POA: Diagnosis not present

## 2017-06-23 DIAGNOSIS — L578 Other skin changes due to chronic exposure to nonionizing radiation: Secondary | ICD-10-CM | POA: Diagnosis not present

## 2017-06-23 DIAGNOSIS — D1801 Hemangioma of skin and subcutaneous tissue: Secondary | ICD-10-CM | POA: Diagnosis not present

## 2017-06-23 DIAGNOSIS — L812 Freckles: Secondary | ICD-10-CM | POA: Diagnosis not present

## 2017-06-23 DIAGNOSIS — D225 Melanocytic nevi of trunk: Secondary | ICD-10-CM | POA: Diagnosis not present

## 2017-06-23 DIAGNOSIS — D2272 Melanocytic nevi of left lower limb, including hip: Secondary | ICD-10-CM | POA: Diagnosis not present

## 2017-08-19 DIAGNOSIS — H93A2 Pulsatile tinnitus, left ear: Secondary | ICD-10-CM | POA: Diagnosis not present

## 2017-08-19 DIAGNOSIS — I879 Disorder of vein, unspecified: Secondary | ICD-10-CM | POA: Diagnosis not present

## 2017-08-19 DIAGNOSIS — R011 Cardiac murmur, unspecified: Secondary | ICD-10-CM | POA: Diagnosis not present

## 2017-08-19 DIAGNOSIS — Z6823 Body mass index (BMI) 23.0-23.9, adult: Secondary | ICD-10-CM | POA: Diagnosis not present

## 2017-09-16 DIAGNOSIS — Z6823 Body mass index (BMI) 23.0-23.9, adult: Secondary | ICD-10-CM | POA: Diagnosis not present

## 2017-09-16 DIAGNOSIS — Z124 Encounter for screening for malignant neoplasm of cervix: Secondary | ICD-10-CM | POA: Diagnosis not present

## 2017-09-16 DIAGNOSIS — R351 Nocturia: Secondary | ICD-10-CM | POA: Diagnosis not present

## 2017-12-04 ENCOUNTER — Other Ambulatory Visit: Payer: Self-pay | Admitting: Internal Medicine

## 2017-12-04 DIAGNOSIS — Z1231 Encounter for screening mammogram for malignant neoplasm of breast: Secondary | ICD-10-CM

## 2018-01-07 ENCOUNTER — Ambulatory Visit
Admission: RE | Admit: 2018-01-07 | Discharge: 2018-01-07 | Disposition: A | Discharge status: Home / Self Care | Payer: PPO | Source: Ambulatory Visit | Attending: Internal Medicine | Admitting: Internal Medicine

## 2018-01-07 DIAGNOSIS — Z1231 Encounter for screening mammogram for malignant neoplasm of breast: Secondary | ICD-10-CM | POA: Diagnosis not present

## 2018-02-08 DIAGNOSIS — E7849 Other hyperlipidemia: Secondary | ICD-10-CM | POA: Diagnosis not present

## 2018-02-08 DIAGNOSIS — E038 Other specified hypothyroidism: Secondary | ICD-10-CM | POA: Diagnosis not present

## 2018-02-08 DIAGNOSIS — R82998 Other abnormal findings in urine: Secondary | ICD-10-CM | POA: Diagnosis not present

## 2018-02-12 DIAGNOSIS — Z6823 Body mass index (BMI) 23.0-23.9, adult: Secondary | ICD-10-CM | POA: Diagnosis not present

## 2018-02-12 DIAGNOSIS — E038 Other specified hypothyroidism: Secondary | ICD-10-CM | POA: Diagnosis not present

## 2018-02-12 DIAGNOSIS — I878 Other specified disorders of veins: Secondary | ICD-10-CM | POA: Diagnosis not present

## 2018-02-12 DIAGNOSIS — H93A2 Pulsatile tinnitus, left ear: Secondary | ICD-10-CM | POA: Diagnosis not present

## 2018-02-12 DIAGNOSIS — K769 Liver disease, unspecified: Secondary | ICD-10-CM | POA: Diagnosis not present

## 2018-02-12 DIAGNOSIS — L658 Other specified nonscarring hair loss: Secondary | ICD-10-CM | POA: Diagnosis not present

## 2018-02-12 DIAGNOSIS — I440 Atrioventricular block, first degree: Secondary | ICD-10-CM | POA: Diagnosis not present

## 2018-02-12 DIAGNOSIS — Z0000 Encounter for general adult medical examination without abnormal findings: Secondary | ICD-10-CM | POA: Diagnosis not present

## 2018-02-12 DIAGNOSIS — E7849 Other hyperlipidemia: Secondary | ICD-10-CM | POA: Diagnosis not present

## 2018-02-12 DIAGNOSIS — R21 Rash and other nonspecific skin eruption: Secondary | ICD-10-CM | POA: Diagnosis not present

## 2018-02-12 DIAGNOSIS — Z1389 Encounter for screening for other disorder: Secondary | ICD-10-CM | POA: Diagnosis not present

## 2018-02-12 DIAGNOSIS — Z1212 Encounter for screening for malignant neoplasm of rectum: Secondary | ICD-10-CM | POA: Diagnosis not present

## 2018-02-12 DIAGNOSIS — R011 Cardiac murmur, unspecified: Secondary | ICD-10-CM | POA: Diagnosis not present

## 2018-02-16 ENCOUNTER — Other Ambulatory Visit: Payer: Self-pay | Admitting: Internal Medicine

## 2018-02-16 DIAGNOSIS — E785 Hyperlipidemia, unspecified: Secondary | ICD-10-CM

## 2018-03-01 ENCOUNTER — Other Ambulatory Visit: Payer: PPO

## 2018-03-02 ENCOUNTER — Ambulatory Visit
Admission: RE | Admit: 2018-03-02 | Discharge: 2018-03-02 | Disposition: A | Discharge status: Home / Self Care | Payer: PPO | Source: Ambulatory Visit | Attending: Internal Medicine | Admitting: Internal Medicine

## 2018-03-02 DIAGNOSIS — E785 Hyperlipidemia, unspecified: Secondary | ICD-10-CM

## 2018-03-08 DIAGNOSIS — H0015 Chalazion left lower eyelid: Secondary | ICD-10-CM | POA: Diagnosis not present

## 2018-04-26 DIAGNOSIS — L570 Actinic keratosis: Secondary | ICD-10-CM | POA: Diagnosis not present

## 2018-10-20 DIAGNOSIS — Z6823 Body mass index (BMI) 23.0-23.9, adult: Secondary | ICD-10-CM | POA: Diagnosis not present

## 2018-10-20 DIAGNOSIS — Z01419 Encounter for gynecological examination (general) (routine) without abnormal findings: Secondary | ICD-10-CM | POA: Diagnosis not present

## 2018-11-11 ENCOUNTER — Other Ambulatory Visit: Payer: Self-pay

## 2018-11-11 DIAGNOSIS — Z20822 Contact with and (suspected) exposure to covid-19: Secondary | ICD-10-CM

## 2018-11-12 LAB — NOVEL CORONAVIRUS, NAA: SARS-CoV-2, NAA: NOT DETECTED

## 2018-11-17 DIAGNOSIS — Z029 Encounter for administrative examinations, unspecified: Secondary | ICD-10-CM | POA: Diagnosis not present

## 2018-12-08 ENCOUNTER — Other Ambulatory Visit: Payer: Self-pay | Admitting: Internal Medicine

## 2018-12-08 DIAGNOSIS — Z1231 Encounter for screening mammogram for malignant neoplasm of breast: Secondary | ICD-10-CM

## 2018-12-29 DIAGNOSIS — L820 Inflamed seborrheic keratosis: Secondary | ICD-10-CM | POA: Diagnosis not present

## 2018-12-29 DIAGNOSIS — L812 Freckles: Secondary | ICD-10-CM | POA: Diagnosis not present

## 2018-12-29 DIAGNOSIS — L821 Other seborrheic keratosis: Secondary | ICD-10-CM | POA: Diagnosis not present

## 2018-12-29 DIAGNOSIS — L738 Other specified follicular disorders: Secondary | ICD-10-CM | POA: Diagnosis not present

## 2019-01-20 ENCOUNTER — Other Ambulatory Visit: Payer: Self-pay

## 2019-01-20 ENCOUNTER — Ambulatory Visit
Admission: RE | Admit: 2019-01-20 | Discharge: 2019-01-20 | Disposition: A | Discharge status: Home / Self Care | Payer: PPO | Source: Ambulatory Visit | Attending: Internal Medicine | Admitting: Internal Medicine

## 2019-01-20 DIAGNOSIS — Z1231 Encounter for screening mammogram for malignant neoplasm of breast: Secondary | ICD-10-CM

## 2019-02-08 DIAGNOSIS — E038 Other specified hypothyroidism: Secondary | ICD-10-CM | POA: Diagnosis not present

## 2019-02-08 DIAGNOSIS — E7849 Other hyperlipidemia: Secondary | ICD-10-CM | POA: Diagnosis not present

## 2019-02-11 DIAGNOSIS — R82998 Other abnormal findings in urine: Secondary | ICD-10-CM | POA: Diagnosis not present

## 2019-02-15 DIAGNOSIS — Z0000 Encounter for general adult medical examination without abnormal findings: Secondary | ICD-10-CM | POA: Diagnosis not present

## 2019-02-15 DIAGNOSIS — Z1339 Encounter for screening examination for other mental health and behavioral disorders: Secondary | ICD-10-CM | POA: Diagnosis not present

## 2019-02-15 DIAGNOSIS — K769 Liver disease, unspecified: Secondary | ICD-10-CM | POA: Diagnosis not present

## 2019-02-15 DIAGNOSIS — I440 Atrioventricular block, first degree: Secondary | ICD-10-CM | POA: Diagnosis not present

## 2019-02-15 DIAGNOSIS — R011 Cardiac murmur, unspecified: Secondary | ICD-10-CM | POA: Diagnosis not present

## 2019-02-15 DIAGNOSIS — R3121 Asymptomatic microscopic hematuria: Secondary | ICD-10-CM | POA: Diagnosis not present

## 2019-02-15 DIAGNOSIS — Z1331 Encounter for screening for depression: Secondary | ICD-10-CM | POA: Diagnosis not present

## 2019-02-15 DIAGNOSIS — F329 Major depressive disorder, single episode, unspecified: Secondary | ICD-10-CM | POA: Diagnosis not present

## 2019-02-15 DIAGNOSIS — E039 Hypothyroidism, unspecified: Secondary | ICD-10-CM | POA: Diagnosis not present

## 2019-02-15 DIAGNOSIS — N952 Postmenopausal atrophic vaginitis: Secondary | ICD-10-CM | POA: Diagnosis not present

## 2019-03-16 DIAGNOSIS — Z1212 Encounter for screening for malignant neoplasm of rectum: Secondary | ICD-10-CM | POA: Diagnosis not present

## 2019-04-20 ENCOUNTER — Ambulatory Visit: Payer: PPO | Attending: Internal Medicine

## 2019-04-20 ENCOUNTER — Ambulatory Visit: Payer: PPO

## 2019-04-20 DIAGNOSIS — Z23 Encounter for immunization: Secondary | ICD-10-CM

## 2019-04-20 NOTE — Progress Notes (Signed)
   Covid-19 Vaccination Clinic  Name:  Jo Vega    MRN: [id number] DOB: 09/21/1950  04/20/2019  Jo Vega was observed post Covid-19 immunization for 15 minutes without incidence. She was provided with Vaccine Information Sheet and instruction to access the V-Safe system.   Jo Vega was instructed to call 911 with any severe reactions post vaccine: Marland Kitchen Difficulty breathing  . Swelling of your face and throat  . A fast heartbeat  . A bad rash all over your body  . Dizziness and weakness    Immunizations Administered    Name Date Dose VIS Date Route   Pfizer COVID-19 Vaccine 04/20/2019  9:30 AM 0.3 mL 03/11/2019 Intramuscular   Manufacturer: Delta   Lot: [id number]   [place]: [id number]

## 2019-05-11 ENCOUNTER — Ambulatory Visit: Payer: PPO | Attending: Internal Medicine

## 2019-05-11 DIAGNOSIS — Z23 Encounter for immunization: Secondary | ICD-10-CM

## 2019-05-11 NOTE — Progress Notes (Signed)
   Covid-19 Vaccination Clinic  Name:  Teghan Beiermann    MRN: [id number] DOB: 02-Oct-1950  05/11/2019  Ms. Wollman was observed post Covid-19 immunization for 15 minutes without incidence. She was provided with Vaccine Information Sheet and instruction to access the V-Safe system.   Ms. Krolick was instructed to call 911 with any severe reactions post vaccine: Marland Kitchen Difficulty breathing  . Swelling of your face and throat  . A fast heartbeat  . A bad rash all over your body  . Dizziness and weakness    Immunizations Administered    Name Date Dose VIS Date Route   Pfizer COVID-19 Vaccine 05/11/2019 12:35 PM 0.3 mL 03/11/2019 Intramuscular   Manufacturer: Industry   Lot: [id number]   [place]: [id number]

## 2019-06-01 DIAGNOSIS — M25511 Pain in right shoulder: Secondary | ICD-10-CM | POA: Diagnosis not present

## 2019-07-13 IMAGING — MG 2D DIGITAL DIAGNOSTIC UNILATERAL RIGHT MAMMOGRAM WITH CAD AND AD
6 series · 6 of 14 positions shown · non-contrast
Comparison: [DATE] [DATE], [DATE], [DATE] [DATE], [DATE], [DATE] [DATE], [DATE],
[DATE] [DATE], [DATE], [DATE] [DATE], [DATE]

CLINICAL DATA: Possible asymmetry with calcifications identified in
the right breast on the recent screening mammogram.

EXAM:
2D DIGITAL DIAGNOSTIC UNILATERAL RIGHT MAMMOGRAM WITH CAD AND
ADJUNCT TOMO

[R MLO synth-2D]
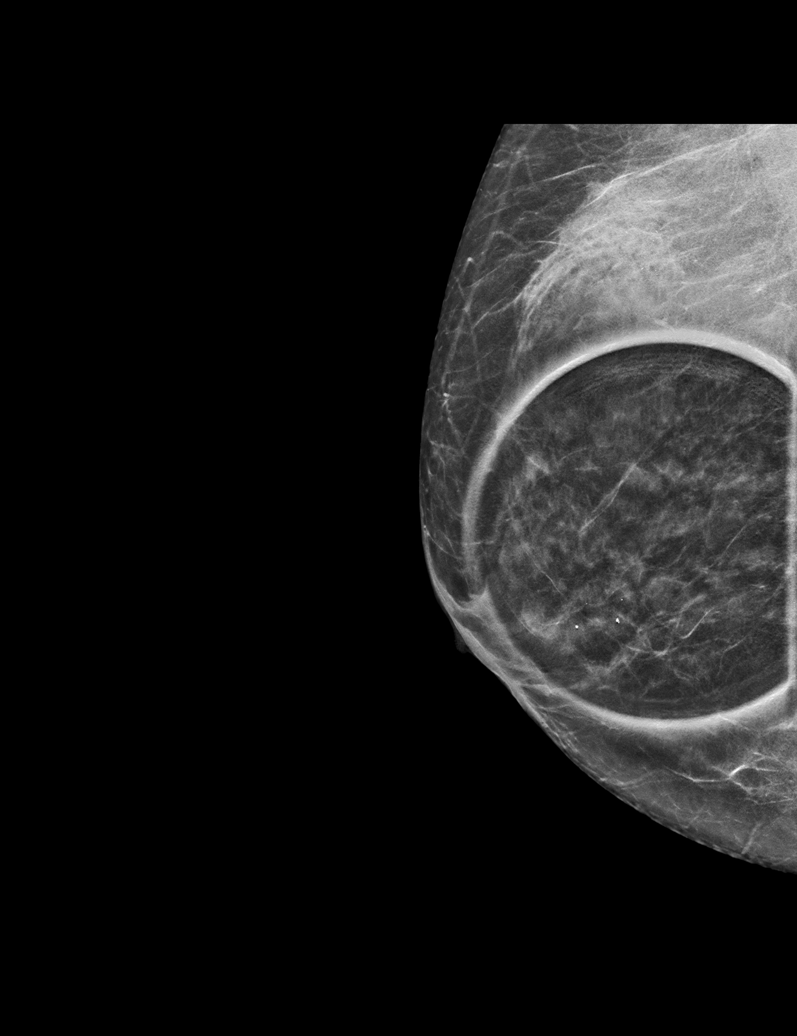

[R MLO]
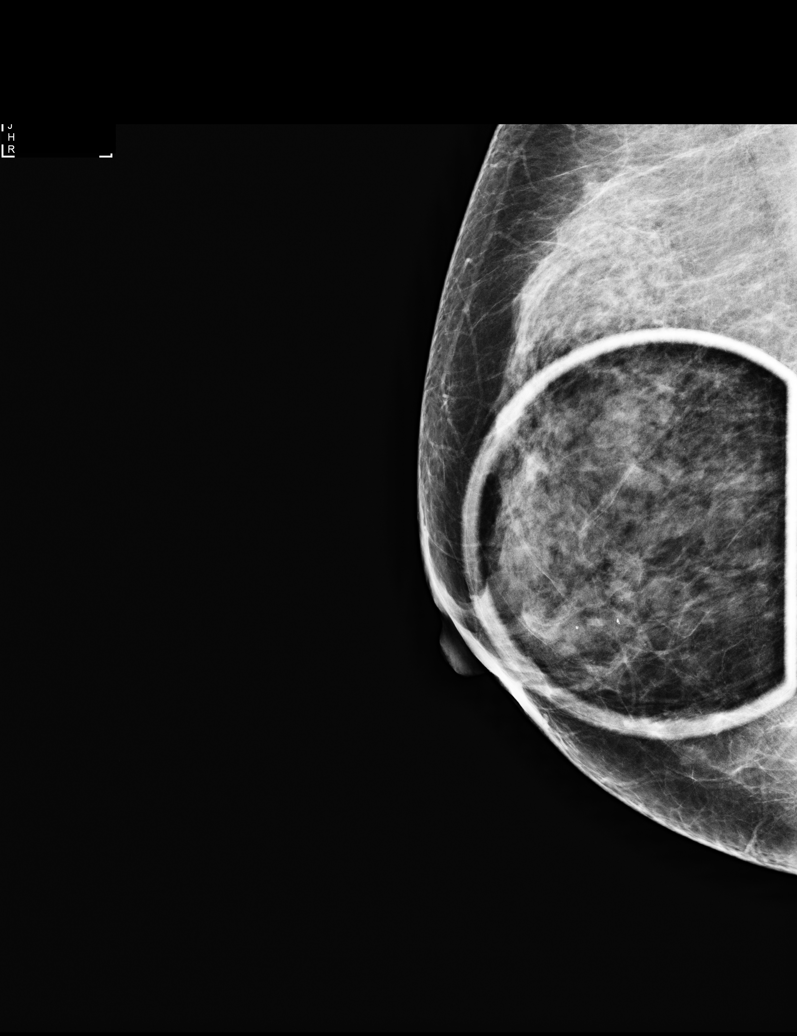

[R CC synth-2D]
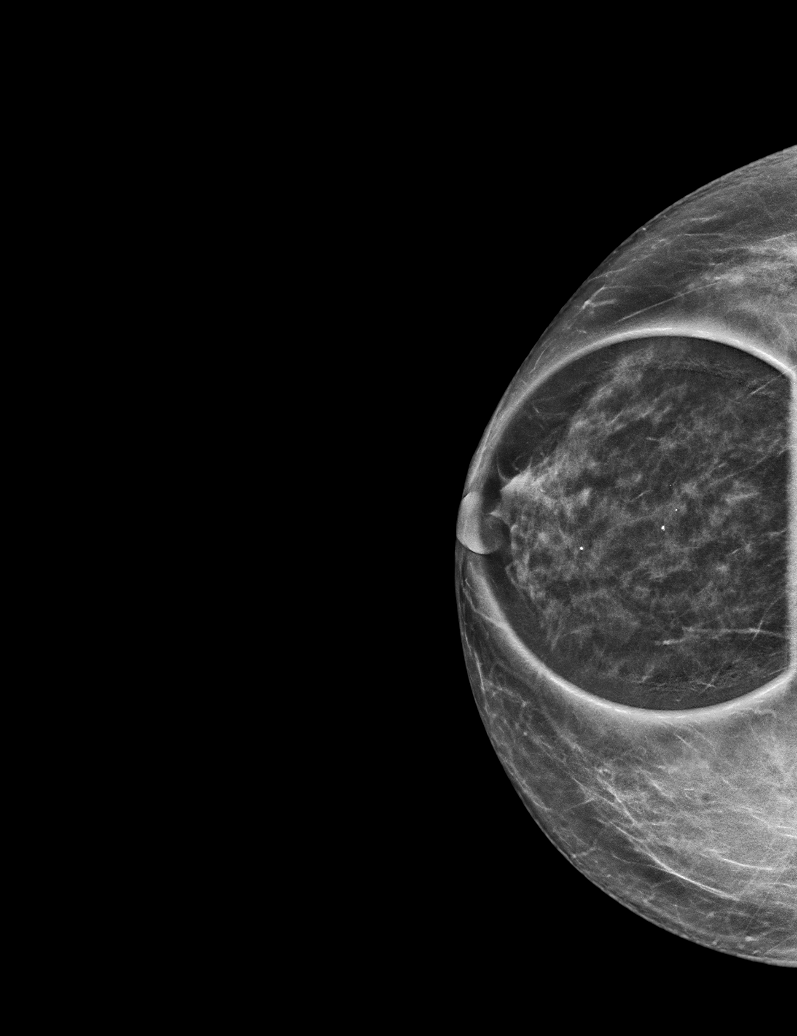

[R CC]
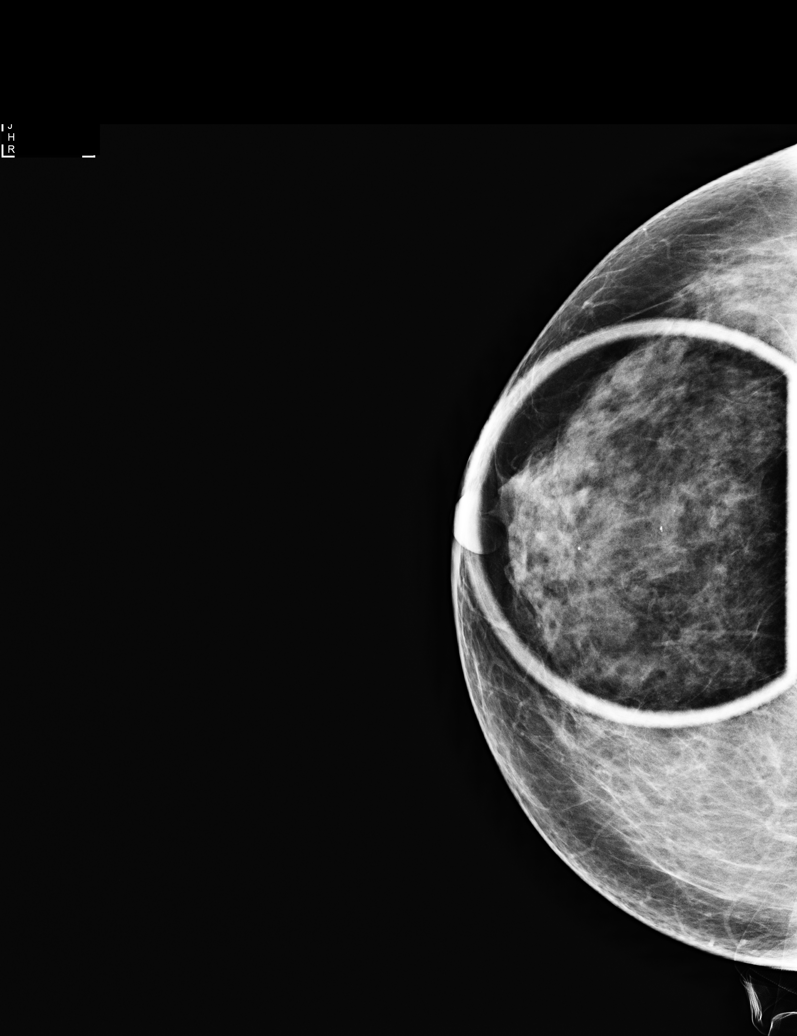

[R MLO tomo · tomo slice 26/51.0]
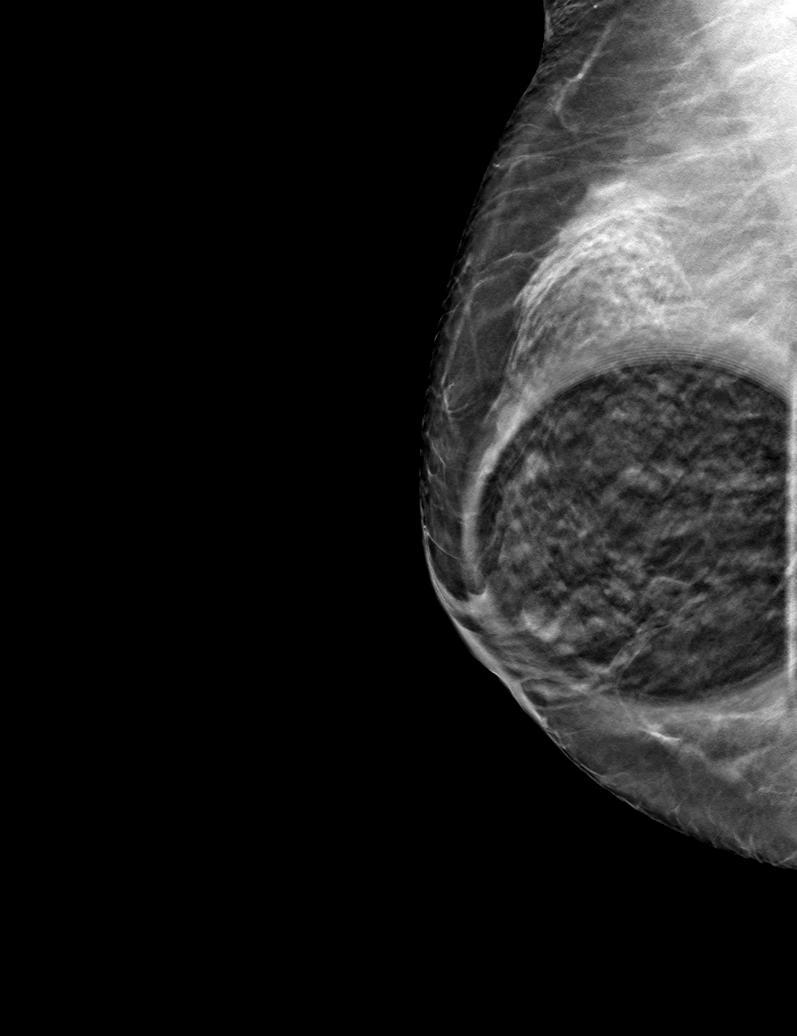

[R CC tomo · tomo slice 25/49.0]
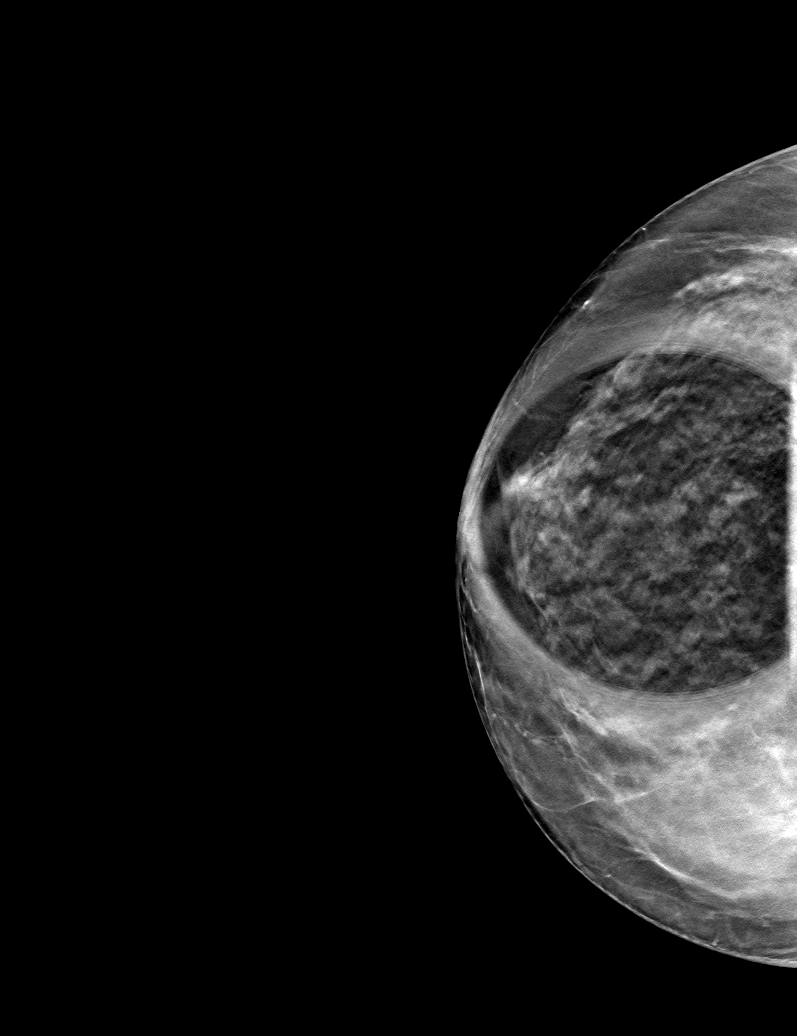

[6 of 14 positions shown; findings below may reference images not displayed]

ACR Breast Density Category c: The breast tissue is heterogeneously
dense, which may obscure small masses.
FINDINGS: Focal spot compression views of the retroareolar right breast shows
dispersion of fibroglandular tissue, without a persistent asymmetry.
There are a few scattered calcifications in this region of the right
breast that are mammographically stable. No suspicious
microcalcifications are identified.

Mammographic images were processed with CAD.
IMPRESSION: No evidence of malignancy in the right breast.

RECOMMENDATION:
Screening mammogram in one year.(Code:JP-3-VB2)

I have discussed the findings and recommendations with the patient.
Results were also provided in writing at the conclusion of the
visit. If applicable, a reminder letter will be sent to the patient
regarding the next appointment.

BI-RADS CATEGORY  1: Negative.

## 2019-09-21 DIAGNOSIS — L820 Inflamed seborrheic keratosis: Secondary | ICD-10-CM | POA: Diagnosis not present

## 2019-09-27 DIAGNOSIS — H2513 Age-related nuclear cataract, bilateral: Secondary | ICD-10-CM | POA: Diagnosis not present

## 2019-09-27 DIAGNOSIS — H5203 Hypermetropia, bilateral: Secondary | ICD-10-CM | POA: Diagnosis not present

## 2019-09-27 DIAGNOSIS — H524 Presbyopia: Secondary | ICD-10-CM | POA: Diagnosis not present

## 2019-10-25 DIAGNOSIS — Z6823 Body mass index (BMI) 23.0-23.9, adult: Secondary | ICD-10-CM | POA: Diagnosis not present

## 2019-10-25 DIAGNOSIS — Z124 Encounter for screening for malignant neoplasm of cervix: Secondary | ICD-10-CM | POA: Diagnosis not present

## 2019-12-12 ENCOUNTER — Other Ambulatory Visit: Payer: Self-pay | Admitting: Internal Medicine

## 2019-12-12 DIAGNOSIS — Z1231 Encounter for screening mammogram for malignant neoplasm of breast: Secondary | ICD-10-CM

## 2020-01-03 DIAGNOSIS — I788 Other diseases of capillaries: Secondary | ICD-10-CM | POA: Diagnosis not present

## 2020-01-03 DIAGNOSIS — L738 Other specified follicular disorders: Secondary | ICD-10-CM | POA: Diagnosis not present

## 2020-01-03 DIAGNOSIS — L812 Freckles: Secondary | ICD-10-CM | POA: Diagnosis not present

## 2020-01-03 DIAGNOSIS — D1801 Hemangioma of skin and subcutaneous tissue: Secondary | ICD-10-CM | POA: Diagnosis not present

## 2020-01-03 DIAGNOSIS — L821 Other seborrheic keratosis: Secondary | ICD-10-CM | POA: Diagnosis not present

## 2020-01-24 ENCOUNTER — Ambulatory Visit: Payer: PPO

## 2020-01-24 ENCOUNTER — Other Ambulatory Visit: Payer: Self-pay

## 2020-01-24 ENCOUNTER — Ambulatory Visit
Admission: RE | Admit: 2020-01-24 | Discharge: 2020-01-24 | Disposition: A | Discharge status: Home / Self Care | Payer: PPO | Source: Ambulatory Visit | Attending: Internal Medicine | Admitting: Internal Medicine

## 2020-01-24 DIAGNOSIS — Z1231 Encounter for screening mammogram for malignant neoplasm of breast: Secondary | ICD-10-CM | POA: Diagnosis not present

## 2020-02-22 DIAGNOSIS — E785 Hyperlipidemia, unspecified: Secondary | ICD-10-CM | POA: Diagnosis not present

## 2020-02-22 DIAGNOSIS — E039 Hypothyroidism, unspecified: Secondary | ICD-10-CM | POA: Diagnosis not present

## 2020-02-27 DIAGNOSIS — R82998 Other abnormal findings in urine: Secondary | ICD-10-CM | POA: Diagnosis not present

## 2020-02-28 ENCOUNTER — Ambulatory Visit: Payer: PPO

## 2020-02-28 DIAGNOSIS — I440 Atrioventricular block, first degree: Secondary | ICD-10-CM | POA: Diagnosis not present

## 2020-02-28 DIAGNOSIS — R011 Cardiac murmur, unspecified: Secondary | ICD-10-CM | POA: Diagnosis not present

## 2020-02-28 DIAGNOSIS — G4700 Insomnia, unspecified: Secondary | ICD-10-CM | POA: Diagnosis not present

## 2020-02-28 DIAGNOSIS — Z23 Encounter for immunization: Secondary | ICD-10-CM | POA: Diagnosis not present

## 2020-02-28 DIAGNOSIS — E039 Hypothyroidism, unspecified: Secondary | ICD-10-CM | POA: Diagnosis not present

## 2020-02-28 DIAGNOSIS — E785 Hyperlipidemia, unspecified: Secondary | ICD-10-CM | POA: Diagnosis not present

## 2020-02-28 DIAGNOSIS — N952 Postmenopausal atrophic vaginitis: Secondary | ICD-10-CM | POA: Diagnosis not present

## 2020-02-28 DIAGNOSIS — Z0000 Encounter for general adult medical examination without abnormal findings: Secondary | ICD-10-CM | POA: Diagnosis not present

## 2020-03-13 DIAGNOSIS — Z1212 Encounter for screening for malignant neoplasm of rectum: Secondary | ICD-10-CM | POA: Diagnosis not present

## 2020-04-30 DIAGNOSIS — Z13820 Encounter for screening for osteoporosis: Secondary | ICD-10-CM | POA: Diagnosis not present

## 2020-07-18 ENCOUNTER — Telehealth: Payer: Self-pay | Admitting: Gastroenterology

## 2020-07-18 NOTE — Telephone Encounter (Signed)
Hi Dr. Fuller Plan, this pt used to see Dr. Olevia Perches. She is due for a colon this year and would like to establish her care with you because her husband is a pt's of yours. Is it ok to schedule pt for her colonoscopy with you? Thank you.

## 2020-07-18 NOTE — Telephone Encounter (Signed)
Yes, OK to schedule with me.

## 2020-07-19 ENCOUNTER — Encounter: Payer: Self-pay | Admitting: Gastroenterology

## 2020-07-19 NOTE — Telephone Encounter (Signed)
Left message to call back to sch colon.

## 2020-07-27 DIAGNOSIS — Z20822 Contact with and (suspected) exposure to covid-19: Secondary | ICD-10-CM | POA: Diagnosis not present

## 2020-08-15 DIAGNOSIS — L300 Nummular dermatitis: Secondary | ICD-10-CM | POA: Diagnosis not present

## 2020-08-15 DIAGNOSIS — L820 Inflamed seborrheic keratosis: Secondary | ICD-10-CM | POA: Diagnosis not present

## 2020-09-10 DIAGNOSIS — M19011 Primary osteoarthritis, right shoulder: Secondary | ICD-10-CM | POA: Diagnosis not present

## 2020-09-20 DIAGNOSIS — M25511 Pain in right shoulder: Secondary | ICD-10-CM | POA: Diagnosis not present

## 2020-09-21 DIAGNOSIS — M19211 Secondary osteoarthritis, right shoulder: Secondary | ICD-10-CM | POA: Diagnosis not present

## 2020-10-03 DIAGNOSIS — H5203 Hypermetropia, bilateral: Secondary | ICD-10-CM | POA: Diagnosis not present

## 2020-10-03 DIAGNOSIS — H2513 Age-related nuclear cataract, bilateral: Secondary | ICD-10-CM | POA: Diagnosis not present

## 2020-10-04 ENCOUNTER — Ambulatory Visit (AMBULATORY_SURGERY_CENTER): Payer: PPO

## 2020-10-04 ENCOUNTER — Other Ambulatory Visit: Payer: Self-pay

## 2020-10-04 VITALS — Ht 64.0 in | Wt 130.0 lb

## 2020-10-04 DIAGNOSIS — Z1211 Encounter for screening for malignant neoplasm of colon: Secondary | ICD-10-CM

## 2020-10-04 MED ORDER — NA SULFATE-K SULFATE-MG SULF 17.5-3.13-1.6 GM/177ML PO SOLN: 1.0000 | Freq: Once | ORAL | 0 refills | Status: AC

## 2020-10-04 NOTE — Progress Notes (Signed)
Pre visit completed via phone call; Patient verified name, DOB, and address; No egg or soy allergy known to patient  No issues with past sedation with any surgeries or procedures Patient denies ever being told they had issues or difficulty with intubation  No FH of Malignant Hyperthermia No diet pills per patient No home 02 use per patient  No blood thinners per patient  Pt denies issues with constipation  No A fib or A flutter  COVID 19 guidelines implemented in PV today with Pt and RN  Pt is fully vaccinated for Covid   NO PA's for preps discussed with pt in PV today  Discussed with pt there will be an out-of-pocket cost for prep and that varies from $0 to 70 dollars   Due to the COVID-19 pandemic we are asking patients to follow certain guidelines.  Pt aware of COVID protocols and [facility] guidelines

## 2020-10-12 ENCOUNTER — Other Ambulatory Visit: Payer: Self-pay

## 2020-10-12 ENCOUNTER — Encounter: Payer: Self-pay | Admitting: Gastroenterology

## 2020-10-12 ENCOUNTER — Ambulatory Visit (AMBULATORY_SURGERY_CENTER): Payer: PPO | Admitting: Gastroenterology

## 2020-10-12 VITALS — BP 98/66 | HR 56 | Temp 97.3°F | Resp 16 | Ht 64.0 in | Wt 130.0 lb

## 2020-10-12 DIAGNOSIS — Z1211 Encounter for screening for malignant neoplasm of colon: Secondary | ICD-10-CM | POA: Diagnosis not present

## 2020-10-12 MED ORDER — SODIUM CHLORIDE 0.9 % IV SOLN: 500.0000 mL | Freq: Once | INTRAVENOUS | Status: DC

## 2020-10-12 NOTE — Patient Instructions (Signed)
You will need another colonoscopy in 10 years if  you are able.   Read all of the handouts given to you by your recovery room nurse.  YOU HAD AN ENDOSCOPIC PROCEDURE TODAY AT [facility]:   Refer to the procedure report that was given to you for any specific questions about what was found during the examination.  If the procedure report does not answer your questions, please call your gastroenterologist to clarify.  If you requested that your care partner not be given the details of your procedure findings, then the procedure report has been included in a sealed envelope for you to review at your convenience later.  YOU SHOULD EXPECT: Some feelings of bloating in the abdomen. Passage of more gas than usual.  Walking can help get rid of the air that was put into your GI tract during the procedure and reduce the bloating. If you had a lower endoscopy (such as a colonoscopy or flexible sigmoidoscopy) you may notice spotting of blood in your stool or on the toilet paper. If you underwent a bowel prep for your procedure, you may not have a normal bowel movement for a few days.  Please Note:  You might notice some irritation and congestion in your nose or some drainage.  This is from the oxygen used during your procedure.  There is no need for concern and it should clear up in a day or so.  SYMPTOMS TO REPORT IMMEDIATELY:  Following lower endoscopy (colonoscopy or flexible sigmoidoscopy):  Excessive amounts of blood in the stool  Significant tenderness or worsening of abdominal pains  Swelling of the abdomen that is new, acute  Fever of 100F or higher   For urgent or emergent issues, a gastroenterologist can be reached at any hour by calling [phone]. Do not use MyChart messaging for urgent concerns.    DIET:  We do recommend a small meal at first, but then you may proceed to your regular diet.  Drink plenty of fluids but you should avoid alcoholic beverages for 24 hours.  Try  to increase the fiber in your diet, and drink plenty of water.  ACTIVITY:  You should plan to take it easy for the rest of today and you should NOT DRIVE or use heavy machinery until tomorrow (because of the sedation medicines used during the test).    FOLLOW UP: Our staff will call the number listed on your records 48-72 hours following your procedure to check on you and address any questions or concerns that you may have regarding the information given to you following your procedure. If we do not reach you, we will leave a message.  We will attempt to reach you two times.  During this call, we will ask if you have developed any symptoms of COVID 19. If you develop any symptoms (ie: fever, flu-like symptoms, shortness of breath, cough etc.) before then, please call [phone].  If you test positive for Covid 19 in the 2 weeks post procedure, please call and report this information to [place].     SIGNATURES/CONFIDENTIALITY: You and/or your care partner have signed paperwork which will be entered into your electronic medical record.  These signatures attest to the fact that that the information above on your After Visit Summary has been reviewed and is understood.  Full responsibility of the confidentiality of this discharge information lies with you and/or your care-partner.

## 2020-10-12 NOTE — Progress Notes (Signed)
Pt's states no medical or surgical changes since previsit or office visit. 

## 2020-10-12 NOTE — Op Note (Signed)
[facility] Patient Name: Jo Vega Procedure Date: 10/12/2020 3:50 PM MRN: [id number] Endoscopist: Ladene Artist , MD Age: 70 Referring MD:  Date of Birth: 1950/10/27 Gender: Female Account #: [id number] Procedure:                Colonoscopy Indications:              Screening for colorectal malignant neoplasm Medicines:                Monitored Anesthesia Care Procedure:                Pre-Anesthesia Assessment:                           - Prior to the procedure, a History and Physical                            was performed, and patient medications and                            allergies were reviewed. The patient's tolerance of                            previous anesthesia was also reviewed. The risks                            and benefits of the procedure and the sedation                            options and risks were discussed with the patient.                            All questions were answered, and informed consent                            was obtained. Prior Anticoagulants: The patient has                            taken no previous anticoagulant or antiplatelet                            agents. ASA Grade Assessment: II - A patient with                            mild systemic disease. After reviewing the risks                            and benefits, the patient was deemed in                            satisfactory condition to undergo the procedure.                           After obtaining informed consent, the colonoscope  was passed under direct vision. Throughout the                            procedure, the patient's blood pressure, pulse, and                            oxygen saturations were monitored continuously. The                            PCF-HQ190L Colonoscope was introduced through the                            anus and advanced to the the cecum, identified by                            appendiceal  orifice and ileocecal valve. The                            ileocecal valve, appendiceal orifice, and rectum                            were photographed. The quality of the bowel                            preparation was excellent. The colonoscopy was                            performed without difficulty. The patient tolerated                            the procedure well. Scope In: 3:58:01 PM Scope Out: 4:16:53 PM Scope Withdrawal Time: 0 hours 15 minutes 35 seconds  Total Procedure Duration: 0 hours 18 minutes 52 seconds  Findings:                 The perianal and digital rectal examinations were                            normal.                           A few small-mouthed diverticula were found in the                            sigmoid colon.                           Internal hemorrhoids were found during                            retroflexion. The hemorrhoids were small and Grade                            I (internal hemorrhoids that do not prolapse).  The exam was otherwise without abnormality on                            direct and retroflexion views. Complications:            No immediate complications. Estimated blood loss:                            None. Estimated Blood Loss:     Estimated blood loss: none. Impression:               - Mild diverticulosis in the sigmoid colon.                           - Internal hemorrhoids.                           - The examination was otherwise normal on direct                            and retroflexion views.                           - No specimens collected. Recommendation:           - Repeat colonoscopy in 10 years for screening                            purposes.                           - Patient has a contact number available for                            emergencies. The signs and symptoms of potential                            delayed complications were discussed with the                             patient. Return to normal activities tomorrow.                            Written discharge instructions were provided to the                            patient.                           - Resume previous diet.                           - Continue present medications. Ladene Artist, MD 10/12/2020 4:20:07 PM This report has been signed electronically.

## 2020-10-12 NOTE — Progress Notes (Signed)
PT taken to PACU. Monitors in place. VSS. Report given to RN. 

## 2020-10-12 NOTE — Progress Notes (Signed)
C.W. vital signs. 

## 2020-10-15 ENCOUNTER — Encounter: Payer: PPO | Admitting: Gastroenterology

## 2020-10-16 ENCOUNTER — Telehealth: Payer: Self-pay | Admitting: *Deleted

## 2020-10-16 NOTE — Telephone Encounter (Signed)
  Follow up Call-  Call back number 10/12/2020  Post procedure Call Back phone  # [phone]  Permission to leave phone message Yes  Some recent data might be hidden     Patient questions:  Do you have a fever, pain , or abdominal swelling? No. Pain Score  0 *  Have you tolerated food without any problems? Yes.    Have you been able to return to your normal activities? Yes.    Do you have any questions about your discharge instructions: Diet   No. Medications  No. Follow up visit  No.  Do you have questions or concerns about your Care? No.  Actions: * If pain score is 4 or above: No action needed, pain <4.  Have you developed a fever since your procedure? no  2.   Have you had an respiratory symptoms (SOB or cough) since your procedure? no  3.   Have you tested positive for COVID 19 since your procedure no  4.   Have you had any family members/close contacts diagnosed with the COVID 19 since your procedure?  no   If yes to any of these questions please route to Joylene John, RN and Joella Prince, RN

## 2020-10-16 NOTE — Telephone Encounter (Signed)
Attempted f/u phone call. No answer. Left message. °

## 2020-10-27 DIAGNOSIS — M545 Low back pain, unspecified: Secondary | ICD-10-CM | POA: Diagnosis not present

## 2020-10-29 DIAGNOSIS — R531 Weakness: Secondary | ICD-10-CM | POA: Diagnosis not present

## 2020-10-29 DIAGNOSIS — M5416 Radiculopathy, lumbar region: Secondary | ICD-10-CM | POA: Diagnosis not present

## 2020-10-29 DIAGNOSIS — R269 Unspecified abnormalities of gait and mobility: Secondary | ICD-10-CM | POA: Diagnosis not present

## 2020-10-31 DIAGNOSIS — Z124 Encounter for screening for malignant neoplasm of cervix: Secondary | ICD-10-CM | POA: Diagnosis not present

## 2020-10-31 DIAGNOSIS — Z6823 Body mass index (BMI) 23.0-23.9, adult: Secondary | ICD-10-CM | POA: Diagnosis not present

## 2020-10-31 DIAGNOSIS — Z779 Other contact with and (suspected) exposures hazardous to health: Secondary | ICD-10-CM | POA: Diagnosis not present

## 2020-12-18 ENCOUNTER — Other Ambulatory Visit: Payer: Self-pay | Admitting: Internal Medicine

## 2020-12-18 DIAGNOSIS — Z1231 Encounter for screening mammogram for malignant neoplasm of breast: Secondary | ICD-10-CM

## 2021-01-15 DIAGNOSIS — K045 Chronic apical periodontitis: Secondary | ICD-10-CM | POA: Diagnosis not present

## 2021-01-22 ENCOUNTER — Emergency Department (HOSPITAL_BASED_OUTPATIENT_CLINIC_OR_DEPARTMENT_OTHER)
Admission: EM | Admit: 2021-01-22 | Discharge: 2021-01-22 | Disposition: A | Discharge status: Home / Self Care | Payer: PPO | Attending: Emergency Medicine | Admitting: Emergency Medicine

## 2021-01-22 ENCOUNTER — Emergency Department (HOSPITAL_BASED_OUTPATIENT_CLINIC_OR_DEPARTMENT_OTHER): Payer: PPO

## 2021-01-22 ENCOUNTER — Other Ambulatory Visit: Payer: Self-pay

## 2021-01-22 ENCOUNTER — Encounter (HOSPITAL_BASED_OUTPATIENT_CLINIC_OR_DEPARTMENT_OTHER): Payer: Self-pay | Admitting: *Deleted

## 2021-01-22 DIAGNOSIS — E039 Hypothyroidism, unspecified: Secondary | ICD-10-CM | POA: Diagnosis not present

## 2021-01-22 DIAGNOSIS — S8002XA Contusion of left knee, initial encounter: Secondary | ICD-10-CM | POA: Insufficient documentation

## 2021-01-22 DIAGNOSIS — R519 Headache, unspecified: Secondary | ICD-10-CM | POA: Diagnosis not present

## 2021-01-22 DIAGNOSIS — W01198A Fall on same level from slipping, tripping and stumbling with subsequent striking against other object, initial encounter: Secondary | ICD-10-CM | POA: Diagnosis not present

## 2021-01-22 DIAGNOSIS — H43811 Vitreous degeneration, right eye: Secondary | ICD-10-CM | POA: Diagnosis not present

## 2021-01-22 DIAGNOSIS — M25532 Pain in left wrist: Secondary | ICD-10-CM | POA: Insufficient documentation

## 2021-01-22 DIAGNOSIS — S0990XA Unspecified injury of head, initial encounter: Secondary | ICD-10-CM | POA: Insufficient documentation

## 2021-01-22 DIAGNOSIS — S8001XA Contusion of right knee, initial encounter: Secondary | ICD-10-CM | POA: Diagnosis not present

## 2021-01-22 DIAGNOSIS — Z79899 Other long term (current) drug therapy: Secondary | ICD-10-CM | POA: Diagnosis not present

## 2021-01-22 DIAGNOSIS — Y9301 Activity, walking, marching and hiking: Secondary | ICD-10-CM | POA: Diagnosis not present

## 2021-01-22 DIAGNOSIS — S52501A Unspecified fracture of the lower end of right radius, initial encounter for closed fracture: Secondary | ICD-10-CM | POA: Diagnosis not present

## 2021-01-22 DIAGNOSIS — S52502A Unspecified fracture of the lower end of left radius, initial encounter for closed fracture: Secondary | ICD-10-CM | POA: Insufficient documentation

## 2021-01-22 DIAGNOSIS — Z043 Encounter for examination and observation following other accident: Secondary | ICD-10-CM | POA: Diagnosis not present

## 2021-01-22 DIAGNOSIS — S6992XA Unspecified injury of left wrist, hand and finger(s), initial encounter: Secondary | ICD-10-CM | POA: Diagnosis present

## 2021-01-22 NOTE — ED Notes (Signed)
ED Provider at bedside. 

## 2021-01-22 NOTE — ED Provider Notes (Signed)
[facility] EMERGENCY DEPARTMENT Provider Note   CSN: [id number] Arrival date & time: 01/22/21  1351     History Chief Complaint  Patient presents with   [facility] Chipman is a 70 y.o. female.  Pt reports she fell while walking on Saturday.  Pt reports she his her knees, her head and her left wrist.  Pt complains of bruises on her knees.  Pain in her left wrist.  Pt reports she has felt dizzy and has had flashes of light in her right eye.  Pt called her MD and was advised to come in because she hit her head.  Pt reports she is concerned that she has a detached retina.  Pt reports she has flashes and sees dark spots (floaters)    The history is provided by the patient. No language interpreter was used.  Fall This is a new problem. Episode onset: 4 days. The problem occurs constantly. The problem has not changed since onset.Nothing aggravates the symptoms. She has tried nothing for the symptoms. The treatment provided no relief.      Past Medical History:  Diagnosis Date   Anxiety    on meds   Depression    on meds   Hyperlipidemia    Thyroid disease    hypothyroidism-on meds    Patient Active Problem List   Diagnosis Date Noted   HYPOTHYROIDISM 12/04/2009   DIARRHEA 12/04/2009    Past Surgical History:  Procedure Laterality Date   COLONOSCOPY  08/2010   DB-F/V-movi(good)-normal -10 yr recall   SHOULDER SURGERY Left    torn ligament   TUBAL LIGATION     WISDOM TOOTH EXTRACTION       OB History   No obstetric history on file.     Family History  Problem Relation Age of Onset   Breast cancer Mother    Colon polyps Maternal Grandmother    Colon cancer Maternal Grandmother    Breast cancer Maternal Grandmother    Esophageal cancer Neg Hx    Stomach cancer Neg Hx    Rectal cancer Neg Hx     Social History   Tobacco Use   Smoking status: Never   Smokeless tobacco: Never  Vaping Use   Vaping Use: Never used  Substance Use Topics    Alcohol use: Yes    Alcohol/week: 5.0 standard drinks    Types: 5 drink(s) per week   Drug use: No    Home Medications Prior to Admission medications   Medication Sig Start Date End Date Taking? Authorizing Provider  ALPRAZolam Duanne Moron) 0.5 MG tablet Take 0.5 mg by mouth 3 (three) times daily. 04/26/20   [provider]  Calcium Carbonate (CALCIUM 500 PO) Take by mouth daily.      [provider]  co-enzyme Q-10 30 MG capsule Take 50 mg by mouth daily.      [provider]  DiphenhydrAMINE HCl, Sleep, (SIMPLY SLEEP PO) Take by mouth Nightly.      [provider]  fish oil-omega-3 fatty acids 1000 MG capsule Take 1 g by mouth 2 (two) times daily.      [provider]  fluocinonide (LIDEX) 0.05 % external solution Apply topically daily as needed. 08/31/20   [provider]  glucosamine-chondroitin 500-400 MG tablet Take 1 tablet by mouth daily.      [provider]  MULTIPLE VITAMIN PO Take by mouth daily.      [provider]  sertraline (  ZOLOFT) 25 MG tablet Take 25 mg by mouth daily. 09/28/20   [provider]  SYNTHROID 100 MCG tablet Take 100 mcg by mouth daily. 09/28/20   [provider]  triamcinolone ointment (KENALOG) 0.1 % Apply topically daily as needed. 08/15/20   [provider]  VITAMIN D PO Take 1 tablet by mouth daily at 6 (six) AM.    [provider]    Allergies    Codeine and Clindamycin/lincomycin  Review of Systems   Review of Systems  All other systems reviewed and are negative.  Physical Exam Updated Vital Signs BP (!) 163/74 (BP Location: Right Arm)   Pulse 77   Temp 99.1 F (37.3 C) (Oral)   Resp 18   Ht 5' 3.5" (1.613 m)   Wt 59 kg   SpO2 100%   BMI 22.67 kg/m   Physical Exam Vitals and nursing note reviewed.  Constitutional:      Appearance: She is well-developed.  HENT:     Head: Normocephalic.     Right Ear: External ear normal.     Left  Ear: External ear normal.     Mouth/Throat:     Mouth: Mucous membranes are moist.  Eyes:     Extraocular Movements: Extraocular movements intact.     Pupils: Pupils are equal, round, and reactive to light.  Cardiovascular:     Rate and Rhythm: Normal rate.     Pulses: Normal pulses.  Pulmonary:     Effort: Pulmonary effort is normal.  Abdominal:     General: Abdomen is flat. There is no distension.  Musculoskeletal:        General: Normal range of motion.     Cervical back: Normal range of motion.     Comments: Bruised bilat knees.  Swollen bruised left wrist,  tender to palpation  nv and ns intact   Skin:    General: Skin is warm.  Neurological:     Mental Status: She is alert and oriented to person, place, and time.  Psychiatric:        Mood and Affect: Mood normal.    ED Results / Procedures / Treatments   Labs (all labs ordered are listed, but only abnormal results are displayed) Labs Reviewed - No data to display  EKG None  Radiology DG Wrist Complete Left  Result Date: 01/22/2021 CLINICAL DATA:  Post fall EXAM: LEFT WRIST - COMPLETE 3+ VIEW COMPARISON:  None. FINDINGS: No malalignment. Suspected acute nondisplaced radial styloid fracture. No malalignment. Degenerative changes at the radiocarpal joint and first [facility] joint. IMPRESSION: Suspected acute nondisplaced radial styloid fracture. Electronically Signed   By: Donavan Foil M.D.   On: 01/22/2021 15:17   CT Head Wo Contrast  Result Date: 01/22/2021 CLINICAL DATA:  Fall 4 days ago, right-sided headache EXAM: CT HEAD WITHOUT CONTRAST TECHNIQUE: Contiguous axial images were obtained from the base of the skull through the vertex without intravenous contrast. COMPARISON:  None. FINDINGS: Brain: There is no acute intracranial hemorrhage, extra-axial fluid collection, or acute infarct. Parenchymal volume is within normal limits. The ventricles are normal in size. There is no mass lesion. There is no midline shift.  Vascular: No hyperdense vessel or unexpected calcification. Skull: Normal. Negative for fracture or focal lesion. Sinuses/Orbits: The imaged paranasal sinuses are clear. The globes and orbits are unremarkable. Other: None. IMPRESSION: No acute intracranial pathology. Electronically Signed   By: Valetta Mole M.D.   On: 01/22/2021 15:16    Procedures Procedures  Medications Ordered in ED Medications - No data to display  ED Course  I have reviewed the triage vital signs and the nursing notes.  Pertinent labs & imaging results that were available during my care of the patient were reviewed by me and considered in my medical decision making (see chart for details).    MDM Rules/Calculators/A&P                           Xray wrist shows a distal radius fracture.  Pt placed in a wrist splint Dr. Darl Householder in to see and examine pt.  Dr. Darl Householder did a bedside ultrasound.  Ultrasound shows vitrous hemorrhage.  I spoke with Dr. Satira Sark Opthalmology who will see pt tomorrow in office.at 8:30am.  Pt advised to call her Orthopaedist to be seen for evaltuion  Final Clinical Impression(s) / ED Diagnoses Final diagnoses:  Closed fracture of distal end of left radius, unspecified fracture morphology, initial encounter  Vitreous detachment of right eye    Rx / DC Orders ED Discharge Orders     None     An After Visit Summary was printed and given to the patient.    Fransico Meadow, PA-C 01/22/21 2345    Drenda Freeze, MD 01/23/21 [phone]

## 2021-01-22 NOTE — ED Triage Notes (Signed)
C/o mechanical fall from standing landing on cement  x 4 days ago abrasion to bil knees and palms of hands , cont right sided h/a , abrasion to left temple and chin. Today c/o flashes of light, sent here by PMD. Denies blood thinners.

## 2021-01-22 NOTE — Discharge Instructions (Addendum)
Wear brace until seen by Orthopaedist.

## 2021-01-23 DIAGNOSIS — H43811 Vitreous degeneration, right eye: Secondary | ICD-10-CM | POA: Diagnosis not present

## 2021-01-24 ENCOUNTER — Ambulatory Visit
Admission: RE | Admit: 2021-01-24 | Discharge: 2021-01-24 | Disposition: A | Discharge status: Home / Self Care | Payer: PPO | Source: Ambulatory Visit | Attending: Internal Medicine | Admitting: Internal Medicine

## 2021-01-24 ENCOUNTER — Other Ambulatory Visit: Payer: Self-pay

## 2021-01-24 DIAGNOSIS — Z1231 Encounter for screening mammogram for malignant neoplasm of breast: Secondary | ICD-10-CM | POA: Diagnosis not present

## 2021-01-26 DIAGNOSIS — S52502A Unspecified fracture of the lower end of left radius, initial encounter for closed fracture: Secondary | ICD-10-CM | POA: Diagnosis not present

## 2021-01-28 DIAGNOSIS — M25532 Pain in left wrist: Secondary | ICD-10-CM | POA: Diagnosis not present

## 2021-01-28 DIAGNOSIS — S63502A Unspecified sprain of left wrist, initial encounter: Secondary | ICD-10-CM | POA: Diagnosis not present

## 2021-02-12 DIAGNOSIS — J351 Hypertrophy of tonsils: Secondary | ICD-10-CM | POA: Diagnosis not present

## 2021-02-12 DIAGNOSIS — J029 Acute pharyngitis, unspecified: Secondary | ICD-10-CM | POA: Diagnosis not present

## 2021-02-12 DIAGNOSIS — R051 Acute cough: Secondary | ICD-10-CM | POA: Diagnosis not present

## 2021-02-12 DIAGNOSIS — Z1152 Encounter for screening for COVID-19: Secondary | ICD-10-CM | POA: Diagnosis not present

## 2021-02-12 DIAGNOSIS — J069 Acute upper respiratory infection, unspecified: Secondary | ICD-10-CM | POA: Diagnosis not present

## 2021-02-12 DIAGNOSIS — R0981 Nasal congestion: Secondary | ICD-10-CM | POA: Diagnosis not present

## 2021-02-26 DIAGNOSIS — D225 Melanocytic nevi of trunk: Secondary | ICD-10-CM | POA: Diagnosis not present

## 2021-02-26 DIAGNOSIS — D2262 Melanocytic nevi of left upper limb, including shoulder: Secondary | ICD-10-CM | POA: Diagnosis not present

## 2021-02-26 DIAGNOSIS — D2261 Melanocytic nevi of right upper limb, including shoulder: Secondary | ICD-10-CM | POA: Diagnosis not present

## 2021-02-26 DIAGNOSIS — L820 Inflamed seborrheic keratosis: Secondary | ICD-10-CM | POA: Diagnosis not present

## 2021-02-26 DIAGNOSIS — D1801 Hemangioma of skin and subcutaneous tissue: Secondary | ICD-10-CM | POA: Diagnosis not present

## 2021-02-26 DIAGNOSIS — D2271 Melanocytic nevi of right lower limb, including hip: Secondary | ICD-10-CM | POA: Diagnosis not present

## 2021-02-26 DIAGNOSIS — L812 Freckles: Secondary | ICD-10-CM | POA: Diagnosis not present

## 2021-02-26 DIAGNOSIS — L821 Other seborrheic keratosis: Secondary | ICD-10-CM | POA: Diagnosis not present

## 2021-02-27 DIAGNOSIS — H43811 Vitreous degeneration, right eye: Secondary | ICD-10-CM | POA: Diagnosis not present

## 2021-04-18 DIAGNOSIS — G4700 Insomnia, unspecified: Secondary | ICD-10-CM | POA: Diagnosis not present

## 2021-04-18 DIAGNOSIS — F329 Major depressive disorder, single episode, unspecified: Secondary | ICD-10-CM | POA: Diagnosis not present

## 2021-04-18 DIAGNOSIS — F4321 Adjustment disorder with depressed mood: Secondary | ICD-10-CM | POA: Diagnosis not present

## 2021-04-18 DIAGNOSIS — F419 Anxiety disorder, unspecified: Secondary | ICD-10-CM | POA: Diagnosis not present

## 2021-05-06 DIAGNOSIS — E785 Hyperlipidemia, unspecified: Secondary | ICD-10-CM | POA: Diagnosis not present

## 2021-05-06 DIAGNOSIS — E039 Hypothyroidism, unspecified: Secondary | ICD-10-CM | POA: Diagnosis not present

## 2021-05-08 DIAGNOSIS — Z1212 Encounter for screening for malignant neoplasm of rectum: Secondary | ICD-10-CM | POA: Diagnosis not present

## 2021-05-09 DIAGNOSIS — E785 Hyperlipidemia, unspecified: Secondary | ICD-10-CM | POA: Diagnosis not present

## 2021-05-09 DIAGNOSIS — F329 Major depressive disorder, single episode, unspecified: Secondary | ICD-10-CM | POA: Diagnosis not present

## 2021-05-09 DIAGNOSIS — Z1331 Encounter for screening for depression: Secondary | ICD-10-CM | POA: Diagnosis not present

## 2021-05-09 DIAGNOSIS — I440 Atrioventricular block, first degree: Secondary | ICD-10-CM | POA: Diagnosis not present

## 2021-05-09 DIAGNOSIS — Z79899 Other long term (current) drug therapy: Secondary | ICD-10-CM | POA: Diagnosis not present

## 2021-05-09 DIAGNOSIS — F4321 Adjustment disorder with depressed mood: Secondary | ICD-10-CM | POA: Diagnosis not present

## 2021-05-09 DIAGNOSIS — Z1339 Encounter for screening examination for other mental health and behavioral disorders: Secondary | ICD-10-CM | POA: Diagnosis not present

## 2021-05-09 DIAGNOSIS — J351 Hypertrophy of tonsils: Secondary | ICD-10-CM | POA: Diagnosis not present

## 2021-05-09 DIAGNOSIS — N952 Postmenopausal atrophic vaginitis: Secondary | ICD-10-CM | POA: Diagnosis not present

## 2021-05-09 DIAGNOSIS — H93A2 Pulsatile tinnitus, left ear: Secondary | ICD-10-CM | POA: Diagnosis not present

## 2021-05-09 DIAGNOSIS — Z0000 Encounter for general adult medical examination without abnormal findings: Secondary | ICD-10-CM | POA: Diagnosis not present

## 2021-05-09 DIAGNOSIS — R82998 Other abnormal findings in urine: Secondary | ICD-10-CM | POA: Diagnosis not present

## 2021-05-11 DIAGNOSIS — M25562 Pain in left knee: Secondary | ICD-10-CM | POA: Diagnosis not present

## 2021-05-16 DIAGNOSIS — E785 Hyperlipidemia, unspecified: Secondary | ICD-10-CM | POA: Diagnosis not present

## 2021-05-16 DIAGNOSIS — S82042A Displaced comminuted fracture of left patella, initial encounter for closed fracture: Secondary | ICD-10-CM | POA: Diagnosis not present

## 2021-05-30 DIAGNOSIS — Z9889 Other specified postprocedural states: Secondary | ICD-10-CM | POA: Diagnosis not present

## 2021-05-30 DIAGNOSIS — S82042A Displaced comminuted fracture of left patella, initial encounter for closed fracture: Secondary | ICD-10-CM | POA: Diagnosis not present

## 2021-06-06 DIAGNOSIS — K1370 Unspecified lesions of oral mucosa: Secondary | ICD-10-CM | POA: Diagnosis not present

## 2021-06-06 DIAGNOSIS — H6122 Impacted cerumen, left ear: Secondary | ICD-10-CM | POA: Diagnosis not present

## 2021-06-21 DIAGNOSIS — S82042A Displaced comminuted fracture of left patella, initial encounter for closed fracture: Secondary | ICD-10-CM | POA: Diagnosis not present

## 2021-06-21 DIAGNOSIS — M25562 Pain in left knee: Secondary | ICD-10-CM | POA: Diagnosis not present

## 2021-07-18 DIAGNOSIS — M7061 Trochanteric bursitis, right hip: Secondary | ICD-10-CM | POA: Diagnosis not present

## 2021-07-29 DIAGNOSIS — M19211 Secondary osteoarthritis, right shoulder: Secondary | ICD-10-CM | POA: Diagnosis not present

## 2021-08-01 DIAGNOSIS — H9042 Sensorineural hearing loss, unilateral, left ear, with unrestricted hearing on the contralateral side: Secondary | ICD-10-CM | POA: Diagnosis not present

## 2021-09-02 DIAGNOSIS — H43811 Vitreous degeneration, right eye: Secondary | ICD-10-CM | POA: Diagnosis not present

## 2021-10-15 DIAGNOSIS — H5203 Hypermetropia, bilateral: Secondary | ICD-10-CM | POA: Diagnosis not present

## 2021-10-15 DIAGNOSIS — H2513 Age-related nuclear cataract, bilateral: Secondary | ICD-10-CM | POA: Diagnosis not present

## 2021-11-05 DIAGNOSIS — Z6822 Body mass index (BMI) 22.0-22.9, adult: Secondary | ICD-10-CM | POA: Diagnosis not present

## 2021-11-05 DIAGNOSIS — Z124 Encounter for screening for malignant neoplasm of cervix: Secondary | ICD-10-CM | POA: Diagnosis not present

## 2021-11-21 DIAGNOSIS — F4321 Adjustment disorder with depressed mood: Secondary | ICD-10-CM | POA: Diagnosis not present

## 2021-11-21 DIAGNOSIS — E785 Hyperlipidemia, unspecified: Secondary | ICD-10-CM | POA: Diagnosis not present

## 2021-11-21 DIAGNOSIS — M19019 Primary osteoarthritis, unspecified shoulder: Secondary | ICD-10-CM | POA: Diagnosis not present

## 2021-11-21 DIAGNOSIS — E039 Hypothyroidism, unspecified: Secondary | ICD-10-CM | POA: Diagnosis not present

## 2021-11-21 DIAGNOSIS — F329 Major depressive disorder, single episode, unspecified: Secondary | ICD-10-CM | POA: Diagnosis not present

## 2021-11-21 DIAGNOSIS — I440 Atrioventricular block, first degree: Secondary | ICD-10-CM | POA: Diagnosis not present

## 2021-11-29 DIAGNOSIS — R87612 Low grade squamous intraepithelial lesion on cytologic smear of cervix (LGSIL): Secondary | ICD-10-CM | POA: Diagnosis not present

## 2021-11-29 DIAGNOSIS — N870 Mild cervical dysplasia: Secondary | ICD-10-CM | POA: Diagnosis not present

## 2021-12-13 DIAGNOSIS — N870 Mild cervical dysplasia: Secondary | ICD-10-CM | POA: Diagnosis not present

## 2021-12-20 ENCOUNTER — Other Ambulatory Visit: Payer: Self-pay | Admitting: Internal Medicine

## 2021-12-20 DIAGNOSIS — Z1231 Encounter for screening mammogram for malignant neoplasm of breast: Secondary | ICD-10-CM

## 2022-01-28 ENCOUNTER — Ambulatory Visit
Admission: RE | Admit: 2022-01-28 | Discharge: 2022-01-28 | Disposition: A | Discharge status: Home / Self Care | Payer: PPO | Source: Ambulatory Visit | Attending: Internal Medicine | Admitting: Internal Medicine

## 2022-01-28 DIAGNOSIS — Z1231 Encounter for screening mammogram for malignant neoplasm of breast: Secondary | ICD-10-CM

## 2022-02-12 DIAGNOSIS — M19011 Primary osteoarthritis, right shoulder: Secondary | ICD-10-CM | POA: Diagnosis not present

## 2022-02-21 DIAGNOSIS — N390 Urinary tract infection, site not specified: Secondary | ICD-10-CM | POA: Diagnosis not present

## 2022-03-17 DIAGNOSIS — L820 Inflamed seborrheic keratosis: Secondary | ICD-10-CM | POA: Diagnosis not present

## 2022-03-17 DIAGNOSIS — D1801 Hemangioma of skin and subcutaneous tissue: Secondary | ICD-10-CM | POA: Diagnosis not present

## 2022-03-17 DIAGNOSIS — L308 Other specified dermatitis: Secondary | ICD-10-CM | POA: Diagnosis not present

## 2022-03-17 DIAGNOSIS — L218 Other seborrheic dermatitis: Secondary | ICD-10-CM | POA: Diagnosis not present

## 2022-03-17 DIAGNOSIS — L812 Freckles: Secondary | ICD-10-CM | POA: Diagnosis not present

## 2022-03-17 DIAGNOSIS — L821 Other seborrheic keratosis: Secondary | ICD-10-CM | POA: Diagnosis not present

## 2022-04-16 DIAGNOSIS — R87612 Low grade squamous intraepithelial lesion on cytologic smear of cervix (LGSIL): Secondary | ICD-10-CM | POA: Diagnosis not present

## 2022-05-02 ENCOUNTER — Other Ambulatory Visit: Payer: Self-pay | Admitting: Orthopaedic Surgery

## 2022-05-02 DIAGNOSIS — Z01818 Encounter for other preprocedural examination: Secondary | ICD-10-CM

## 2022-05-02 DIAGNOSIS — M25511 Pain in right shoulder: Secondary | ICD-10-CM | POA: Diagnosis not present

## 2022-05-09 DIAGNOSIS — M25511 Pain in right shoulder: Secondary | ICD-10-CM | POA: Diagnosis not present

## 2022-05-09 DIAGNOSIS — Z01812 Encounter for preprocedural laboratory examination: Secondary | ICD-10-CM | POA: Diagnosis not present

## 2022-05-12 ENCOUNTER — Ambulatory Visit
Admission: RE | Admit: 2022-05-12 | Discharge: 2022-05-12 | Disposition: A | Discharge status: Home / Self Care | Payer: PPO | Source: Ambulatory Visit | Attending: Orthopaedic Surgery | Admitting: Orthopaedic Surgery

## 2022-05-12 ENCOUNTER — Other Ambulatory Visit: Payer: PPO

## 2022-05-12 DIAGNOSIS — Z01818 Encounter for other preprocedural examination: Secondary | ICD-10-CM

## 2022-05-12 DIAGNOSIS — M25511 Pain in right shoulder: Secondary | ICD-10-CM | POA: Diagnosis not present

## 2022-05-13 DIAGNOSIS — Z780 Asymptomatic menopausal state: Secondary | ICD-10-CM | POA: Diagnosis not present

## 2022-05-15 DIAGNOSIS — F329 Major depressive disorder, single episode, unspecified: Secondary | ICD-10-CM | POA: Diagnosis not present

## 2022-05-15 DIAGNOSIS — E039 Hypothyroidism, unspecified: Secondary | ICD-10-CM | POA: Diagnosis not present

## 2022-05-15 DIAGNOSIS — F4321 Adjustment disorder with depressed mood: Secondary | ICD-10-CM | POA: Diagnosis not present

## 2022-05-15 DIAGNOSIS — Z23 Encounter for immunization: Secondary | ICD-10-CM | POA: Diagnosis not present

## 2022-05-15 DIAGNOSIS — L659 Nonscarring hair loss, unspecified: Secondary | ICD-10-CM | POA: Diagnosis not present

## 2022-05-15 DIAGNOSIS — Z0000 Encounter for general adult medical examination without abnormal findings: Secondary | ICD-10-CM | POA: Diagnosis not present

## 2022-05-15 DIAGNOSIS — Z1331 Encounter for screening for depression: Secondary | ICD-10-CM | POA: Diagnosis not present

## 2022-05-15 DIAGNOSIS — N952 Postmenopausal atrophic vaginitis: Secondary | ICD-10-CM | POA: Diagnosis not present

## 2022-05-15 DIAGNOSIS — Z1339 Encounter for screening examination for other mental health and behavioral disorders: Secondary | ICD-10-CM | POA: Diagnosis not present

## 2022-05-15 DIAGNOSIS — I440 Atrioventricular block, first degree: Secondary | ICD-10-CM | POA: Diagnosis not present

## 2022-05-15 DIAGNOSIS — M19019 Primary osteoarthritis, unspecified shoulder: Secondary | ICD-10-CM | POA: Diagnosis not present

## 2022-05-15 DIAGNOSIS — R011 Cardiac murmur, unspecified: Secondary | ICD-10-CM | POA: Diagnosis not present

## 2022-05-15 DIAGNOSIS — R82998 Other abnormal findings in urine: Secondary | ICD-10-CM | POA: Diagnosis not present

## 2022-05-20 DIAGNOSIS — M19011 Primary osteoarthritis, right shoulder: Secondary | ICD-10-CM | POA: Diagnosis not present

## 2022-05-20 DIAGNOSIS — M6281 Muscle weakness (generalized): Secondary | ICD-10-CM | POA: Diagnosis not present

## 2022-05-20 DIAGNOSIS — M25611 Stiffness of right shoulder, not elsewhere classified: Secondary | ICD-10-CM | POA: Diagnosis not present

## 2022-05-21 DIAGNOSIS — G8918 Other acute postprocedural pain: Secondary | ICD-10-CM | POA: Diagnosis not present

## 2022-05-21 DIAGNOSIS — M19011 Primary osteoarthritis, right shoulder: Secondary | ICD-10-CM | POA: Diagnosis not present

## 2022-05-23 ENCOUNTER — Other Ambulatory Visit (HOSPITAL_COMMUNITY): Payer: Self-pay | Admitting: Internal Medicine

## 2022-05-23 DIAGNOSIS — M6281 Muscle weakness (generalized): Secondary | ICD-10-CM | POA: Diagnosis not present

## 2022-05-23 DIAGNOSIS — M25611 Stiffness of right shoulder, not elsewhere classified: Secondary | ICD-10-CM | POA: Diagnosis not present

## 2022-05-23 DIAGNOSIS — M19011 Primary osteoarthritis, right shoulder: Secondary | ICD-10-CM | POA: Diagnosis not present

## 2022-05-23 DIAGNOSIS — R011 Cardiac murmur, unspecified: Secondary | ICD-10-CM

## 2022-05-26 ENCOUNTER — Other Ambulatory Visit: Payer: PPO

## 2022-05-27 DIAGNOSIS — M19011 Primary osteoarthritis, right shoulder: Secondary | ICD-10-CM | POA: Diagnosis not present

## 2022-05-27 DIAGNOSIS — M25611 Stiffness of right shoulder, not elsewhere classified: Secondary | ICD-10-CM | POA: Diagnosis not present

## 2022-05-27 DIAGNOSIS — M6281 Muscle weakness (generalized): Secondary | ICD-10-CM | POA: Diagnosis not present

## 2022-05-30 DIAGNOSIS — M19011 Primary osteoarthritis, right shoulder: Secondary | ICD-10-CM | POA: Diagnosis not present

## 2022-06-03 DIAGNOSIS — M6281 Muscle weakness (generalized): Secondary | ICD-10-CM | POA: Diagnosis not present

## 2022-06-03 DIAGNOSIS — M25611 Stiffness of right shoulder, not elsewhere classified: Secondary | ICD-10-CM | POA: Diagnosis not present

## 2022-06-03 DIAGNOSIS — M19011 Primary osteoarthritis, right shoulder: Secondary | ICD-10-CM | POA: Diagnosis not present

## 2022-06-06 DIAGNOSIS — M19011 Primary osteoarthritis, right shoulder: Secondary | ICD-10-CM | POA: Diagnosis not present

## 2022-06-06 DIAGNOSIS — M25611 Stiffness of right shoulder, not elsewhere classified: Secondary | ICD-10-CM | POA: Diagnosis not present

## 2022-06-06 DIAGNOSIS — M6281 Muscle weakness (generalized): Secondary | ICD-10-CM | POA: Diagnosis not present

## 2022-06-12 DIAGNOSIS — M25611 Stiffness of right shoulder, not elsewhere classified: Secondary | ICD-10-CM | POA: Diagnosis not present

## 2022-06-12 DIAGNOSIS — M19011 Primary osteoarthritis, right shoulder: Secondary | ICD-10-CM | POA: Diagnosis not present

## 2022-06-12 DIAGNOSIS — M6281 Muscle weakness (generalized): Secondary | ICD-10-CM | POA: Diagnosis not present

## 2022-06-20 DIAGNOSIS — M6281 Muscle weakness (generalized): Secondary | ICD-10-CM | POA: Diagnosis not present

## 2022-06-20 DIAGNOSIS — M25611 Stiffness of right shoulder, not elsewhere classified: Secondary | ICD-10-CM | POA: Diagnosis not present

## 2022-06-20 DIAGNOSIS — M19011 Primary osteoarthritis, right shoulder: Secondary | ICD-10-CM | POA: Diagnosis not present

## 2022-06-23 ENCOUNTER — Ambulatory Visit (HOSPITAL_COMMUNITY): Payer: PPO | Attending: Internal Medicine

## 2022-06-23 DIAGNOSIS — R011 Cardiac murmur, unspecified: Secondary | ICD-10-CM

## 2022-06-23 LAB — ECHOCARDIOGRAM COMPLETE
Area-P 1/2: 3.08 cm2
S' Lateral: 2.3 cm

## 2022-06-24 DIAGNOSIS — M6281 Muscle weakness (generalized): Secondary | ICD-10-CM | POA: Diagnosis not present

## 2022-06-24 DIAGNOSIS — M19011 Primary osteoarthritis, right shoulder: Secondary | ICD-10-CM | POA: Diagnosis not present

## 2022-06-24 DIAGNOSIS — M25611 Stiffness of right shoulder, not elsewhere classified: Secondary | ICD-10-CM | POA: Diagnosis not present

## 2022-07-01 DIAGNOSIS — M19011 Primary osteoarthritis, right shoulder: Secondary | ICD-10-CM | POA: Diagnosis not present

## 2022-07-01 DIAGNOSIS — M6281 Muscle weakness (generalized): Secondary | ICD-10-CM | POA: Diagnosis not present

## 2022-07-01 DIAGNOSIS — M25611 Stiffness of right shoulder, not elsewhere classified: Secondary | ICD-10-CM | POA: Diagnosis not present

## 2022-07-08 DIAGNOSIS — M25611 Stiffness of right shoulder, not elsewhere classified: Secondary | ICD-10-CM | POA: Diagnosis not present

## 2022-07-08 DIAGNOSIS — M6281 Muscle weakness (generalized): Secondary | ICD-10-CM | POA: Diagnosis not present

## 2022-07-08 DIAGNOSIS — M19011 Primary osteoarthritis, right shoulder: Secondary | ICD-10-CM | POA: Diagnosis not present

## 2022-07-14 DIAGNOSIS — M19011 Primary osteoarthritis, right shoulder: Secondary | ICD-10-CM | POA: Diagnosis not present

## 2022-07-14 DIAGNOSIS — M25611 Stiffness of right shoulder, not elsewhere classified: Secondary | ICD-10-CM | POA: Diagnosis not present

## 2022-07-14 DIAGNOSIS — M6281 Muscle weakness (generalized): Secondary | ICD-10-CM | POA: Diagnosis not present

## 2022-07-22 DIAGNOSIS — M25611 Stiffness of right shoulder, not elsewhere classified: Secondary | ICD-10-CM | POA: Diagnosis not present

## 2022-07-22 DIAGNOSIS — M19011 Primary osteoarthritis, right shoulder: Secondary | ICD-10-CM | POA: Diagnosis not present

## 2022-07-22 DIAGNOSIS — M6281 Muscle weakness (generalized): Secondary | ICD-10-CM | POA: Diagnosis not present

## 2022-07-29 DIAGNOSIS — M19011 Primary osteoarthritis, right shoulder: Secondary | ICD-10-CM | POA: Diagnosis not present

## 2022-07-29 DIAGNOSIS — M6281 Muscle weakness (generalized): Secondary | ICD-10-CM | POA: Diagnosis not present

## 2022-07-29 DIAGNOSIS — M25611 Stiffness of right shoulder, not elsewhere classified: Secondary | ICD-10-CM | POA: Diagnosis not present

## 2022-08-05 DIAGNOSIS — M6281 Muscle weakness (generalized): Secondary | ICD-10-CM | POA: Diagnosis not present

## 2022-08-05 DIAGNOSIS — M19011 Primary osteoarthritis, right shoulder: Secondary | ICD-10-CM | POA: Diagnosis not present

## 2022-08-05 DIAGNOSIS — M25611 Stiffness of right shoulder, not elsewhere classified: Secondary | ICD-10-CM | POA: Diagnosis not present

## 2022-08-12 DIAGNOSIS — M19011 Primary osteoarthritis, right shoulder: Secondary | ICD-10-CM | POA: Diagnosis not present

## 2022-08-12 DIAGNOSIS — M6281 Muscle weakness (generalized): Secondary | ICD-10-CM | POA: Diagnosis not present

## 2022-08-12 DIAGNOSIS — R030 Elevated blood-pressure reading, without diagnosis of hypertension: Secondary | ICD-10-CM | POA: Diagnosis not present

## 2022-08-12 DIAGNOSIS — M25611 Stiffness of right shoulder, not elsewhere classified: Secondary | ICD-10-CM | POA: Diagnosis not present

## 2022-08-15 DIAGNOSIS — R030 Elevated blood-pressure reading, without diagnosis of hypertension: Secondary | ICD-10-CM | POA: Diagnosis not present

## 2022-08-19 DIAGNOSIS — M6281 Muscle weakness (generalized): Secondary | ICD-10-CM | POA: Diagnosis not present

## 2022-08-19 DIAGNOSIS — M25611 Stiffness of right shoulder, not elsewhere classified: Secondary | ICD-10-CM | POA: Diagnosis not present

## 2022-08-19 DIAGNOSIS — M19011 Primary osteoarthritis, right shoulder: Secondary | ICD-10-CM | POA: Diagnosis not present

## 2022-08-26 DIAGNOSIS — M19011 Primary osteoarthritis, right shoulder: Secondary | ICD-10-CM | POA: Diagnosis not present

## 2022-08-28 DIAGNOSIS — M6281 Muscle weakness (generalized): Secondary | ICD-10-CM | POA: Diagnosis not present

## 2022-08-28 DIAGNOSIS — M19011 Primary osteoarthritis, right shoulder: Secondary | ICD-10-CM | POA: Diagnosis not present

## 2022-08-28 DIAGNOSIS — M25611 Stiffness of right shoulder, not elsewhere classified: Secondary | ICD-10-CM | POA: Diagnosis not present

## 2022-09-02 DIAGNOSIS — M6281 Muscle weakness (generalized): Secondary | ICD-10-CM | POA: Diagnosis not present

## 2022-09-02 DIAGNOSIS — M25611 Stiffness of right shoulder, not elsewhere classified: Secondary | ICD-10-CM | POA: Diagnosis not present

## 2022-09-02 DIAGNOSIS — M19011 Primary osteoarthritis, right shoulder: Secondary | ICD-10-CM | POA: Diagnosis not present

## 2022-09-08 ENCOUNTER — Emergency Department (HOSPITAL_BASED_OUTPATIENT_CLINIC_OR_DEPARTMENT_OTHER)
Admission: EM | Admit: 2022-09-08 | Discharge: 2022-09-08 | Disposition: A | Discharge status: Home / Self Care | Payer: PPO | Attending: Emergency Medicine | Admitting: Emergency Medicine

## 2022-09-08 ENCOUNTER — Other Ambulatory Visit: Payer: Self-pay

## 2022-09-08 ENCOUNTER — Encounter (HOSPITAL_BASED_OUTPATIENT_CLINIC_OR_DEPARTMENT_OTHER): Payer: Self-pay

## 2022-09-08 DIAGNOSIS — T63441A Toxic effect of venom of bees, accidental (unintentional), initial encounter: Secondary | ICD-10-CM | POA: Insufficient documentation

## 2022-09-08 DIAGNOSIS — R2232 Localized swelling, mass and lump, left upper limb: Secondary | ICD-10-CM | POA: Diagnosis present

## 2022-09-08 MED ORDER — EPINEPHRINE 0.3 MG/0.3ML IJ SOAJ: 0.3000 mg | INTRAMUSCULAR | 0 refills | Status: AC | PRN

## 2022-09-08 MED ORDER — TRIAMCINOLONE ACETONIDE 0.1 % EX CREA: 1.0000 | TOPICAL_CREAM | Freq: Two times a day (BID) | CUTANEOUS | 0 refills | Status: AC

## 2022-09-08 NOTE — Discharge Instructions (Addendum)
You have been seen today for your complaint of bee sting. Your discharge medications include Kenalog.  This is a topical steroid.  Apply twice a day and wrap the area and Saran wrap. Benadryl.  Take 1 pill every 8 hours as needed for itching if the topical steroid is not working. EpiPen.  Use this if you develop signs and symptoms of anaphylaxis including feeling like your throat is closing up or difficulty breathing. Follow up with: Your PCP in 3 to 4 days for reevaluation Please seek immediate medical care if you develop any of the following symptoms: Chest tightness or pain. Wheezing, or trouble swallowing or breathing. Light-headedness, dizziness, or fainting. Itchy, raised, red patches on the skin beyond the sting site. Abdominal cramping, nausea, vomiting, or diarrhea. Trouble swallowing or a swollen tongue, throat, or lips. At this time there does not appear to be the presence of an emergent medical condition, however there is always the potential for conditions to change. Please read and follow the below instructions.  Do not take your medicine if  develop an itchy rash, swelling in your mouth or lips, or difficulty breathing; call 911 and seek immediate emergency medical attention if this occurs.  You may review your lab tests and imaging results in their entirety on your MyChart account.  Please discuss all results of fully with your primary care provider and other specialist at your follow-up visit.  Note: Portions of this text may have been transcribed using voice recognition software. Every effort was made to ensure accuracy; however, inadvertent computerized transcription errors may still be present.

## 2022-09-08 NOTE — ED Triage Notes (Signed)
Patient here POV from Home.  Endorses being stung yesterday at 1600 by a Bee to her left hand. Since then has had progressive Swelling and Redness to her Forearm.   NAD Noted during Triage. A&Ox4. GCS 15. Ambulatory.

## 2022-09-08 NOTE — ED Provider Notes (Signed)
[place] [facility] AT [facility] Provider Note   CSN: [id number] Arrival date & time: 09/08/22  1205     History  Chief Complaint  Patient presents with   Insect Bite    Jo Vega is a 72 y.o. female.  With history of anxiety, depression, hyperlipidemia who presents to the ED for evaluation of a bee sting.  She states she was working in the garden when she was stung by a small bee at approximately 4 PM yesterday.  She noticed near immediate swelling of the left hand and distal forearm.  She states the area has an intense itching sensation.  She denies any numbness or tingling.  She called her PCP office today and was encouraged to come to the ED for further evaluation.  She denies any respiratory symptoms.  She denies history of anaphylaxis.  She has been using topical hydrocortisone and tea tree oil.  HPI     Home Medications Prior to Admission medications   Medication Sig Start Date End Date Taking? Authorizing Provider  EPINEPHrine 0.3 mg/0.3 mL IJ SOAJ injection Inject 0.3 mg into the muscle as needed for anaphylaxis. 09/08/22  Yes Divya Munshi, Edsel Petrin, PA-C  triamcinolone cream (KENALOG) 0.1 % Apply 1 Application topically 2 (two) times daily. 09/08/22  Yes Shayona Hibbitts, Edsel Petrin, PA-C  ALPRAZolam Prudy Feeler) 0.5 MG tablet Take 0.5 mg by mouth 3 (three) times daily. 04/26/20   [provider]  Calcium Carbonate (CALCIUM 500 PO) Take by mouth daily.      [provider]  co-enzyme Q-10 30 MG capsule Take 50 mg by mouth daily.      [provider]  DiphenhydrAMINE HCl, Sleep, (SIMPLY SLEEP PO) Take by mouth Nightly.      [provider]  fish oil-omega-3 fatty acids 1000 MG capsule Take 1 g by mouth 2 (two) times daily.      [provider]  fluocinonide (LIDEX) 0.05 % external solution Apply topically daily as needed. 08/31/20   [provider]  glucosamine-chondroitin 500-400 MG tablet Take 1 tablet by  mouth daily.      [provider]  MULTIPLE VITAMIN PO Take by mouth daily.      [provider]  sertraline (ZOLOFT) 25 MG tablet Take 25 mg by mouth daily. 09/28/20   [provider]  SYNTHROID 100 MCG tablet Take 100 mcg by mouth daily. 09/28/20   [provider]  triamcinolone ointment (KENALOG) 0.1 % Apply topically daily as needed. 08/15/20   [provider]  VITAMIN D PO Take 1 tablet by mouth daily at 6 (six) AM.    [provider]      Allergies    Codeine, Bee venom, and Clindamycin/lincomycin    Review of Systems   Review of Systems  Skin:  Positive for rash.  All other systems reviewed and are negative.   Physical Exam Updated Vital Signs BP (!) 141/68 (BP Location: Right Arm)   Pulse 66   Temp 98.7 F (37.1 C) (Oral)   Resp 18   Ht 5\' 4"  (1.626 m)   Wt 59 kg   SpO2 100%   BMI 22.31 kg/m  Physical Exam Vitals and nursing note reviewed.  Constitutional:      General: She is not in acute distress.    Appearance: Normal appearance. She is normal weight. She is not ill-appearing.  HENT:     Head: Normocephalic and atraumatic.  Pulmonary:     Effort: Pulmonary  effort is normal. No respiratory distress.  Abdominal:     General: Abdomen is flat.  Musculoskeletal:        General: Normal range of motion.     Cervical back: Neck supple.  Skin:    General: Skin is warm and dry.     Comments: Swelling and erythema of the dorsum of the left hand with extension into the distal half of the dorsum of the left forearm.  No obvious puncture wounds.  Neurological:     Mental Status: She is alert and oriented to person, place, and time.  Psychiatric:        Mood and Affect: Mood normal.        Behavior: Behavior normal.     ED Results / Procedures / Treatments   Labs (all labs ordered are listed, but only abnormal results are displayed) Labs Reviewed - No data to display  EKG None  Radiology No results  found.  Procedures Procedures    Medications Ordered in ED Medications - No data to display  ED Course/ Medical Decision Making/ A&P                             Medical Decision Making Risk Prescription drug management.  This patient presents to the ED for concern of bee sting, this involves an extensive number of treatment options, and is a complaint that carries with it a high risk of complications and morbidity.  The differential diagnosis includes localized allergic reaction, cellulitis, anaphylaxis  Additional history obtained from: Nursing notes from this visit.  Afebrile, hemodynamically stable.  72 year old female presenting to the ED for evaluation of a bee sting to the left dorsum of the hand.  She has swelling to the left hand and distal forearm.  Her neurovascular status is intact.  She denies any signs or symptoms of anaphylaxis.  History and physical exam consistent with localized reaction to a bee sting.  She was sent a prescription for Kenalog and an EpiPen and educated on appropriate use of these medications.  She was encouraged to use p.o. Benadryl as well as a topical steroid for her symptoms.  Area of swelling was outlined with a skin marker in the ED.  She was encouraged to follow-up with her PCP for reevaluation in 3 to 4 days.  She was given strict return precautions including signs and symptoms of anaphylaxis.  Stable at discharge.  At this time there does not appear to be any evidence of an acute emergency medical condition and the patient appears stable for discharge with appropriate outpatient follow up. Diagnosis was discussed with patient who verbalizes understanding of care plan and is agreeable to discharge. I have discussed return precautions with patient who verbalizes understanding. Patient encouraged to follow-up with their PCP within 4 days. All questions answered.  Note: Portions of this report may have been transcribed using voice recognition software.  Every effort was made to ensure accuracy; however, inadvertent computerized transcription errors may still be present.        Final Clinical Impression(s) / ED Diagnoses Final diagnoses:  Bee sting reaction, accidental or unintentional, initial encounter    Rx / DC Orders ED Discharge Orders          Ordered    EPINEPHrine 0.3 mg/0.3 mL IJ SOAJ injection  As needed        09/08/22 1229    triamcinolone cream (KENALOG) 0.1 %  2 times daily  09/08/22 1229              Michelle Piper, PA-C 09/08/22 1241    Melene Plan, DO 09/08/22 1409

## 2022-09-10 DIAGNOSIS — M6281 Muscle weakness (generalized): Secondary | ICD-10-CM | POA: Diagnosis not present

## 2022-09-10 DIAGNOSIS — M25611 Stiffness of right shoulder, not elsewhere classified: Secondary | ICD-10-CM | POA: Diagnosis not present

## 2022-09-10 DIAGNOSIS — M19011 Primary osteoarthritis, right shoulder: Secondary | ICD-10-CM | POA: Diagnosis not present

## 2022-09-15 ENCOUNTER — Other Ambulatory Visit (HOSPITAL_COMMUNITY): Payer: Self-pay | Admitting: Internal Medicine

## 2022-09-15 ENCOUNTER — Telehealth: Payer: Self-pay

## 2022-09-15 DIAGNOSIS — I5189 Other ill-defined heart diseases: Secondary | ICD-10-CM

## 2022-09-15 NOTE — Telephone Encounter (Signed)
Transition Care Management Unsuccessful Follow-up Telephone Call  Date of discharge and from where:  09/08/2022 [facility]  Attempts:  1st Attempt  Reason for unsuccessful TCM follow-up call:  Left voice message  Duayne Brideau [place]  [facility] Community Resource Care Guide   ??[email or web address]  ?? [phone]   Website: [email or web address]  [email or web address]      

## 2022-09-16 ENCOUNTER — Telehealth: Payer: Self-pay

## 2022-09-16 NOTE — Telephone Encounter (Signed)
Transition Care Management Unsuccessful Follow-up Telephone Call  Date of discharge and from where:  09/08/2022 [facility]  Attempts:  2nd Attempt  Reason for unsuccessful TCM follow-up call:  Left voice message  Amberlie Gaillard [facility]  [facility] Population [facility] Community Resource Care Guide   ??[email or web address]  ?? [phone]   Website: [email or web address]

## 2022-10-06 ENCOUNTER — Ambulatory Visit (HOSPITAL_COMMUNITY): Payer: PPO | Attending: Cardiovascular Disease

## 2022-10-06 DIAGNOSIS — I5189 Other ill-defined heart diseases: Secondary | ICD-10-CM | POA: Insufficient documentation

## 2022-10-06 LAB — ECHOCARDIOGRAM COMPLETE
AR max vel: 3.18 cm2
AV Area VTI: 3.03 cm2
AV Area mean vel: 3.02 cm2
AV Mean grad: 6 mmHg
AV Peak grad: 10.6 mmHg
Ao pk vel: 1.63 m/s
Area-P 1/2: 3.3 cm2
S' Lateral: 2.3 cm

## 2022-10-30 DIAGNOSIS — H2513 Age-related nuclear cataract, bilateral: Secondary | ICD-10-CM | POA: Diagnosis not present

## 2022-10-30 DIAGNOSIS — H0012 Chalazion right lower eyelid: Secondary | ICD-10-CM | POA: Diagnosis not present

## 2022-10-30 DIAGNOSIS — H5203 Hypermetropia, bilateral: Secondary | ICD-10-CM | POA: Diagnosis not present

## 2022-12-18 ENCOUNTER — Other Ambulatory Visit: Payer: Self-pay | Admitting: Internal Medicine

## 2022-12-18 DIAGNOSIS — Z1231 Encounter for screening mammogram for malignant neoplasm of breast: Secondary | ICD-10-CM

## 2023-01-30 ENCOUNTER — Ambulatory Visit: Payer: PPO

## 2023-02-04 ENCOUNTER — Ambulatory Visit
Admission: RE | Admit: 2023-02-04 | Discharge: 2023-02-04 | Disposition: A | Discharge status: Home / Self Care | Payer: PPO | Source: Ambulatory Visit | Attending: Internal Medicine | Admitting: Internal Medicine

## 2023-02-04 DIAGNOSIS — Z1231 Encounter for screening mammogram for malignant neoplasm of breast: Secondary | ICD-10-CM

## 2023-03-11 DIAGNOSIS — Z124 Encounter for screening for malignant neoplasm of cervix: Secondary | ICD-10-CM | POA: Diagnosis not present

## 2023-03-11 DIAGNOSIS — Z6823 Body mass index (BMI) 23.0-23.9, adult: Secondary | ICD-10-CM | POA: Diagnosis not present

## 2023-04-08 DIAGNOSIS — L308 Other specified dermatitis: Secondary | ICD-10-CM | POA: Diagnosis not present

## 2023-04-08 DIAGNOSIS — L812 Freckles: Secondary | ICD-10-CM | POA: Diagnosis not present

## 2023-04-08 DIAGNOSIS — D1801 Hemangioma of skin and subcutaneous tissue: Secondary | ICD-10-CM | POA: Diagnosis not present

## 2023-04-08 DIAGNOSIS — L821 Other seborrheic keratosis: Secondary | ICD-10-CM | POA: Diagnosis not present

## 2023-04-08 DIAGNOSIS — L570 Actinic keratosis: Secondary | ICD-10-CM | POA: Diagnosis not present

## 2023-04-08 DIAGNOSIS — L438 Other lichen planus: Secondary | ICD-10-CM | POA: Diagnosis not present

## 2023-05-19 DIAGNOSIS — M19011 Primary osteoarthritis, right shoulder: Secondary | ICD-10-CM | POA: Diagnosis not present

## 2023-06-25 DIAGNOSIS — F419 Anxiety disorder, unspecified: Secondary | ICD-10-CM | POA: Diagnosis not present

## 2023-06-25 DIAGNOSIS — F4321 Adjustment disorder with depressed mood: Secondary | ICD-10-CM | POA: Diagnosis not present

## 2023-06-25 DIAGNOSIS — R194 Change in bowel habit: Secondary | ICD-10-CM | POA: Diagnosis not present

## 2023-06-25 DIAGNOSIS — J069 Acute upper respiratory infection, unspecified: Secondary | ICD-10-CM | POA: Diagnosis not present

## 2023-07-21 DIAGNOSIS — Z1212 Encounter for screening for malignant neoplasm of rectum: Secondary | ICD-10-CM | POA: Diagnosis not present

## 2023-07-21 DIAGNOSIS — E039 Hypothyroidism, unspecified: Secondary | ICD-10-CM | POA: Diagnosis not present

## 2023-07-21 DIAGNOSIS — E785 Hyperlipidemia, unspecified: Secondary | ICD-10-CM | POA: Diagnosis not present

## 2023-07-28 DIAGNOSIS — I5189 Other ill-defined heart diseases: Secondary | ICD-10-CM | POA: Diagnosis not present

## 2023-07-28 DIAGNOSIS — F4321 Adjustment disorder with depressed mood: Secondary | ICD-10-CM | POA: Diagnosis not present

## 2023-07-28 DIAGNOSIS — E039 Hypothyroidism, unspecified: Secondary | ICD-10-CM | POA: Diagnosis not present

## 2023-07-28 DIAGNOSIS — I340 Nonrheumatic mitral (valve) insufficiency: Secondary | ICD-10-CM | POA: Diagnosis not present

## 2023-07-28 DIAGNOSIS — E785 Hyperlipidemia, unspecified: Secondary | ICD-10-CM | POA: Diagnosis not present

## 2023-07-28 DIAGNOSIS — I440 Atrioventricular block, first degree: Secondary | ICD-10-CM | POA: Diagnosis not present

## 2023-07-28 DIAGNOSIS — F329 Major depressive disorder, single episode, unspecified: Secondary | ICD-10-CM | POA: Diagnosis not present

## 2023-07-28 DIAGNOSIS — R011 Cardiac murmur, unspecified: Secondary | ICD-10-CM | POA: Diagnosis not present

## 2023-07-28 DIAGNOSIS — M19019 Primary osteoarthritis, unspecified shoulder: Secondary | ICD-10-CM | POA: Diagnosis not present

## 2023-07-28 DIAGNOSIS — R82998 Other abnormal findings in urine: Secondary | ICD-10-CM | POA: Diagnosis not present

## 2023-07-28 DIAGNOSIS — N952 Postmenopausal atrophic vaginitis: Secondary | ICD-10-CM | POA: Diagnosis not present

## 2023-07-28 DIAGNOSIS — F419 Anxiety disorder, unspecified: Secondary | ICD-10-CM | POA: Diagnosis not present

## 2023-07-28 DIAGNOSIS — Z0000 Encounter for general adult medical examination without abnormal findings: Secondary | ICD-10-CM | POA: Diagnosis not present

## 2023-08-17 DIAGNOSIS — H43813 Vitreous degeneration, bilateral: Secondary | ICD-10-CM | POA: Diagnosis not present

## 2023-11-03 DIAGNOSIS — H43811 Vitreous degeneration, right eye: Secondary | ICD-10-CM | POA: Diagnosis not present

## 2023-11-03 DIAGNOSIS — H52203 Unspecified astigmatism, bilateral: Secondary | ICD-10-CM | POA: Diagnosis not present

## 2023-11-03 DIAGNOSIS — H2513 Age-related nuclear cataract, bilateral: Secondary | ICD-10-CM | POA: Diagnosis not present

## 2023-12-22 ENCOUNTER — Other Ambulatory Visit: Payer: Self-pay | Admitting: Internal Medicine

## 2023-12-22 DIAGNOSIS — Z1231 Encounter for screening mammogram for malignant neoplasm of breast: Secondary | ICD-10-CM

## 2024-01-11 DIAGNOSIS — E039 Hypothyroidism, unspecified: Secondary | ICD-10-CM | POA: Diagnosis not present

## 2024-02-08 ENCOUNTER — Ambulatory Visit
Admission: RE | Admit: 2024-02-08 | Discharge: 2024-02-08 | Disposition: A | Discharge status: Home / Self Care | Source: Ambulatory Visit | Attending: Internal Medicine | Admitting: Internal Medicine

## 2024-02-08 DIAGNOSIS — Z1231 Encounter for screening mammogram for malignant neoplasm of breast: Secondary | ICD-10-CM | POA: Diagnosis not present
